# Patient Record
Sex: Female | Born: 1950 | Race: White | Hispanic: No | Smoking: Never smoker
Health system: Southern US, Community
[De-identification: ages and names within clinical notes are randomized; demographics above are authoritative.]

## PROBLEM LIST (undated history)

## (undated) HISTORY — PX: VARICOSE VEIN SURGERY: SHX832

## (undated) HISTORY — PX: TONSILLECTOMY: SUR1361

## (undated) HISTORY — PX: MYOMECTOMY: SHX85

## (undated) HISTORY — PX: OOPHORECTOMY: SHX86

## (undated) HISTORY — PX: COSMETIC SURGERY: SHX468

## (undated) HISTORY — PX: TUBAL LIGATION: SHX77

---

## 1998-01-11 ENCOUNTER — Other Ambulatory Visit: Admission: RE | Admit: 1998-01-11 | Discharge: 1998-01-11 | Payer: Self-pay | Admitting: Gynecology

## 1998-05-31 ENCOUNTER — Other Ambulatory Visit: Admission: RE | Admit: 1998-05-31 | Discharge: 1998-05-31 | Payer: Self-pay | Admitting: Gynecology

## 1998-09-27 ENCOUNTER — Other Ambulatory Visit: Admission: RE | Admit: 1998-09-27 | Discharge: 1998-09-27 | Payer: Self-pay | Admitting: Gynecology

## 1999-04-06 ENCOUNTER — Other Ambulatory Visit: Admission: RE | Admit: 1999-04-06 | Discharge: 1999-04-06 | Payer: Self-pay | Admitting: Gynecology

## 1999-06-19 ENCOUNTER — Other Ambulatory Visit: Admission: RE | Admit: 1999-06-19 | Discharge: 1999-06-19 | Payer: Self-pay | Admitting: General Surgery

## 2000-02-06 ENCOUNTER — Other Ambulatory Visit: Admission: RE | Admit: 2000-02-06 | Discharge: 2000-02-06 | Payer: Self-pay | Admitting: Gynecology

## 2001-09-08 ENCOUNTER — Other Ambulatory Visit: Admission: RE | Admit: 2001-09-08 | Discharge: 2001-09-08 | Payer: Self-pay | Admitting: Gynecology

## 2001-09-22 ENCOUNTER — Other Ambulatory Visit: Admission: RE | Admit: 2001-09-22 | Discharge: 2001-09-22 | Payer: Self-pay | Admitting: Gynecology

## 2001-11-12 ENCOUNTER — Encounter: Admission: RE | Admit: 2001-11-12 | Discharge: 2001-11-12 | Payer: Self-pay | Admitting: Gynecology

## 2001-11-12 ENCOUNTER — Encounter: Payer: Self-pay | Admitting: Gynecology

## 2003-02-09 ENCOUNTER — Other Ambulatory Visit: Admission: RE | Admit: 2003-02-09 | Discharge: 2003-02-09 | Payer: Self-pay | Admitting: Gynecology

## 2004-02-10 ENCOUNTER — Other Ambulatory Visit: Admission: RE | Admit: 2004-02-10 | Discharge: 2004-02-10 | Payer: Self-pay | Admitting: Gynecology

## 2010-04-03 ENCOUNTER — Ambulatory Visit: Payer: Self-pay | Admitting: Family Medicine

## 2010-04-03 DIAGNOSIS — B351 Tinea unguium: Secondary | ICD-10-CM | POA: Insufficient documentation

## 2010-05-30 ENCOUNTER — Encounter: Payer: Self-pay | Admitting: Family Medicine

## 2010-06-27 NOTE — Assessment & Plan Note (Signed)
Summary: BRAND NEW PT/TO EST/BURN ON HAND/CJR   Vital Signs:  Patient profile:   60 year old female Menstrual status:  hysterectomy Height:      64 inches Weight:      130 pounds BMI:     22.40 Temp:     98.3 degrees F oral BP sitting:   116 / 70  (left arm) Cuff size:   regular  Vitals Entered By: Kern Reap CMA Duncan Dull) (April 03, 2010 8:34 AM) CC: left hand tingling, concerns with thumb nails Comments while using cell phone, tingling and burning from left hand to elbow     Menstrual Status hysterectomy   CC:  left hand tingling and concerns with thumb nails.  History of Present Illness: milia is a 60 year old single female, G2, P2, whose has a 49 year old daughter.........Marland Kitchen  Another daughter, who died at age 27 of a ruptured cns  aneurysm.......Marland Kitchenwho comes in today as a new patient for general checkup.  She said last Tuesday.  She was using her cell phone and noticed some burning sensation in her hand.  She took her cell phone to the phone company and they said it was normal.  She also has some discoloration of her thumb nails.  Review of systems negative except she is due for colonoscopy and a tetanus booster............she declines a colonoscopy also  Pap smear annually by Dr. Laural Roes.  She had one ovary removed.  Bilateral breast implants varicose vein surgery, right leg, and a tonsillectomy.  She's been on Estratest h.s. x 10 years.  Information given on hormone replacement therapy.    She declined a flu shot   Preventive Screening-Counseling & Management  Alcohol-Tobacco     Smoking Status: never  Hep-HIV-STD-Contraception     Dental Visit-last 6 months yes      Drug Use:  no.    Allergies (verified): No Known Drug Allergies  Past History:  Past medical, surgical, family and social histories (including risk factors) reviewed, and no changes noted (except as noted below).  Past Surgical History: Oophorectomy cosmetic surgery vein surgery left  leg Tonsillectomy  Family History: Reviewed history and no changes required. Father: deceased Mother: healthy Siblings: 3 brothers - healthy, 1 sister - healthy  1 daughter - healthy, 1 daughter - deceased  Social History: Reviewed history and no changes required. Occupation:medical records Divorced Never Smoked Alcohol use-yes Drug use-no Smoking Status:  never Drug Use:  no Dental Care w/in 6 mos.:  yes  Review of Systems      See HPI  Physical Exam  General:  Well-developed,well-nourished,in no acute distress; alert,appropriate and cooperative throughout examination Head:  Normocephalic and atraumatic without obvious abnormalities. No apparent alopecia or balding. Eyes:  No corneal or conjunctival inflammation noted. EOMI. Perrla. Funduscopic exam benign, without hemorrhages, exudates or papilledema. Vision grossly normal. Ears:  External ear exam shows no significant lesions or deformities.  Otoscopic examination reveals clear canals, tympanic membranes are intact bilaterally without bulging, retraction, inflammation or discharge. Hearing is grossly normal bilaterally. Nose:  External nasal examination shows no deformity or inflammation. Nasal mucosa are pink and moist without lesions or exudates. Mouth:  Oral mucosa and oropharynx without lesions or exudates.  Teeth in good repair. Neck:  No deformities, masses, or tenderness noted. Chest Wall:  No deformities, masses, or tenderness noted. Breasts:  no palpable masses.  Bilateral implants Lungs:  Normal respiratory effort, chest expands symmetrically. Lungs are clear to auscultation, no crackles or wheezes. Heart:  Normal rate and regular  rhythm. S1 and S2 normal without gallop, murmur, click, rub or other extra sounds. Abdomen:  Bowel sounds positive,abdomen soft and non-tender without masses, organomegaly or hernias noted. Msk:  No deformity or scoliosis noted of thoracic or lumbar spine.   Pulses:  R and L  carotid,radial,femoral,dorsalis pedis and posterior tibial pulses are full and equal bilaterally Extremities:  No clubbing, cyanosis, edema, or deformity noted with normal full range of motion of all joints.   Neurologic:  No cranial nerve deficits noted. Station and gait are normal. Plantar reflexes are down-going bilaterally. DTRs are symmetrical throughout. Sensory, motor and coordinative functions appear intact. Skin:  Intact without suspicious lesions or rashes Cervical Nodes:  No lymphadenopathy noted Axillary Nodes:  No palpable lymphadenopathy Inguinal Nodes:  No significant adenopathy Psych:  Cognition and judgment appear intact. Alert and cooperative with normal attention span and concentration. No apparent delusions, illusions, hallucinations   Impression & Recommendations:  Problem # 1:  Preventive Health Care (ICD-V70.0) Assessment New  Problem # 2:  DERMATOPHYTOSIS OF NAIL (ICD-110.1) Assessment: New  Complete Medication List: 1)  Estrostep Fe 1-20/1-30/1-35 Mg-mcg Tabs (Norethindron-ethinyl estrad-fe)  Other Orders: Gastroenterology Referral (GI) Tdap => 85yrs IM (29528) Admin 1st Vaccine (41324)  Patient Instructions: 1)  soak in volume nails weekly until the fungus is gone. 2)  Please schedule a follow-up appointment in 1 year. 3)  It is important that you exercise regularly at least 20 minutes 5 times a week. If you develop chest pain, have severe difficulty breathing, or feel very tired , stop exercising immediately and seek medical attention. 4)  Schedule your mammogram. 5)  Schedule a colonoscopy/sigmoidoscopy to help detect colon cancer. 6)  Take calcium +Vitamin D daily. 7)  Take an Aspirin every day.   Orders Added: 1)  New Patient 40-64 years [99386] 2)  Gastroenterology Referral [GI] 3)  Tdap => 32yrs IM [90715] 4)  Admin 1st Vaccine [90471]   Immunizations Administered:  Tetanus Vaccine:    Vaccine Type: Tdap    Site: right deltoid    Mfr:  GlaxoSmithKline    Dose: 0.5 ml    Route: IM    Given by: Kern Reap CMA (AAMA)    Exp. Date: 03/16/2012    Lot #: [id number]    VIS given: 04/14/08 version given April 03, 2010.    Physician counseled: yes   Immunizations Administered:  Tetanus Vaccine:    Vaccine Type: Tdap    Site: right deltoid    Mfr: GlaxoSmithKline    Dose: 0.5 ml    Route: IM    Given by: Kern Reap CMA (AAMA)    Exp. Date: 03/16/2012    Lot #: [id number]    VIS given: 04/14/08 version given April 03, 2010.    Physician counseled: yes

## 2010-06-29 NOTE — Letter (Signed)
Summary: Referral - not able to see patient  [facility]  [address] [place], [place] [address]   Phone: [phone]  Fax: [phone]    May 30, 2010  Kelle Darting, MD [address] [place], [place] [address]   Re:   Yesenia Watson DOB:  16-Sep-1950 MRN:   [id number]    Dear Dr. Tawanna Cooler:  Thank you for your kind referral of the above patient.  We have attempted to schedule the recommended procedure Screening Colonoscopy but have not been able to schedule because:  ___ The patient was not available by phone and/or has not returned our calls.  X   The patient declined to schedule the procedure at this time.  We appreciate the referral and hope that we will have the opportunity to treat this patient in the future.    Sincerely,    [facility] [phone]

## 2011-06-27 ENCOUNTER — Ambulatory Visit: Payer: Self-pay | Admitting: Family Medicine

## 2011-06-28 ENCOUNTER — Ambulatory Visit: Payer: Self-pay | Admitting: Family Medicine

## 2012-08-07 ENCOUNTER — Ambulatory Visit: Payer: BC Managed Care – PPO

## 2012-08-07 ENCOUNTER — Ambulatory Visit (INDEPENDENT_AMBULATORY_CARE_PROVIDER_SITE_OTHER): Payer: BC Managed Care – PPO | Admitting: Family Medicine

## 2012-08-07 VITALS — BP 118/60 | HR 60 | Temp 98.1°F | Resp 16 | Ht 64.0 in | Wt 131.4 lb

## 2012-08-07 DIAGNOSIS — K5900 Constipation, unspecified: Secondary | ICD-10-CM

## 2012-08-07 DIAGNOSIS — G8929 Other chronic pain: Secondary | ICD-10-CM

## 2012-08-07 DIAGNOSIS — R109 Unspecified abdominal pain: Secondary | ICD-10-CM

## 2012-08-07 DIAGNOSIS — R1011 Right upper quadrant pain: Secondary | ICD-10-CM

## 2012-08-07 DIAGNOSIS — K5909 Other constipation: Secondary | ICD-10-CM

## 2012-08-07 LAB — POCT URINALYSIS DIPSTICK
Bilirubin, UA: NEGATIVE
Glucose, UA: NEGATIVE
Ketones, UA: NEGATIVE
Leukocytes, UA: NEGATIVE
Nitrite, UA: NEGATIVE
Protein, UA: NEGATIVE
Spec Grav, UA: <=1.005
Urobilinogen, UA: 0.2
pH, UA: 6

## 2012-08-07 LAB — POCT UA - MICROSCOPIC ONLY
Bacteria, U Microscopic: NEGATIVE
Casts, Ur, LPF, POC: NEGATIVE
Crystals, Ur, HPF, POC: NEGATIVE
Epithelial cells, urine per micros: NEGATIVE
Mucus, UA: NEGATIVE
WBC, Ur, HPF, POC: NEGATIVE
Yeast, UA: NEGATIVE

## 2012-08-07 LAB — POCT CBC
Granulocyte percent: 46.5 %G (ref 37–80)
HCT, POC: 35.8 % — AB (ref 37.7–47.9)
Hemoglobin: 11.6 g/dL — AB (ref 12.2–16.2)
Lymph, poc: 2.3 (ref 0.6–3.4)
MCH, POC: 30 pg (ref 27–31.2)
MCHC: 32.4 g/dL (ref 31.8–35.4)
MCV: 92.6 fL (ref 80–97)
MID (cbc): 0.3 (ref 0–0.9)
MPV: 9.5 fL (ref 0–99.8)
POC Granulocyte: 2.3 (ref 2–6.9)
POC LYMPH PERCENT: 47.1 %L (ref 10–50)
POC MID %: 6.4 %M (ref 0–12)
Platelet Count, POC: 216 10*3/uL (ref 142–424)
RBC: 3.87 M/uL — AB (ref 4.04–5.48)
RDW, POC: 13.1 %
WBC: 4.9 10*3/uL (ref 4.6–10.2)

## 2012-08-07 NOTE — Progress Notes (Signed)
Subjective:       Results for orders placed in visit on 08/07/12  POCT CBC      Result Value Range   WBC 4.9  4.6 - 10.2 K/uL   Lymph, poc 2.3  0.6 - 3.4   POC LYMPH PERCENT 47.1  10 - 50 %L   MID (cbc) 0.3  0 - 0.9   POC MID % 6.4  0 - 12 %M   POC Granulocyte 2.3  2 - 6.9   Granulocyte percent 46.5  37 - 80 %G   RBC 3.87 (*) 4.04 - 5.48 M/uL   Hemoglobin 11.6 (*) 12.2 - 16.2 g/dL   HCT, POC 96.0 (*) 45.4 - 47.9 %   MCV 92.6  80 - 97 fL   MCH, POC 30.0  27 - 31.2 pg   MCHC 32.4  31.8 - 35.4 g/dL   RDW, POC 09.8     Platelet Count, POC 216  142 - 424 K/uL   MPV 9.5  0 - 99.8 fL  POCT UA - MICROSCOPIC ONLY      Result Value Range   WBC, Ur, HPF, POC neg     RBC, urine, microscopic 0-2     Bacteria, U Microscopic neg     Mucus, UA neg     Epithelial cells, urine per micros neg     Crystals, Ur, HPF, POC neg     Casts, Ur, LPF, POC neg     Yeast, UA neg    POCT URINALYSIS DIPSTICK      Result Value Range   Color, UA yellow     Clarity, UA clear     Glucose, UA neg     Bilirubin, UA neg     Ketones, UA neg     Spec Grav, UA <=1.005     Blood, UA small     pH, UA 6.0     Protein, UA neg     Urobilinogen, UA 0.2     Nitrite, UA neg     Leukocytes, UA Negative     [facility] reading (PRIMARY) by  Dr. Alwyn Ren   .

## 2012-08-07 NOTE — Patient Instructions (Addendum)
Fluids  Fiber  Miralax one dose daily (2 if needed) until stools are loose, then taper back.  In the future use as needed.  When bowels are doing well begin metamucil or another good fiber additive  Recommend getting a screening colonoscopy sometime.  Call Dr. Elnoria Howard and [address] or [facility] for an appointment.

## 2012-08-08 LAB — COMPREHENSIVE METABOLIC PANEL
ALT: 22 U/L (ref 0–35)
AST: 20 U/L (ref 0–37)
Albumin: 4.4 g/dL (ref 3.5–5.2)
Alkaline Phosphatase: 38 U/L — ABNORMAL LOW (ref 39–117)
BUN: 12 mg/dL (ref 6–23)
CO2: 26 mEq/L (ref 19–32)
Calcium: 9.6 mg/dL (ref 8.4–10.5)
Chloride: 102 mEq/L (ref 96–112)
Creat: 0.83 mg/dL (ref 0.50–1.10)
Glucose, Bld: 103 mg/dL — ABNORMAL HIGH (ref 70–99)
Potassium: 4.2 mEq/L (ref 3.5–5.3)
Sodium: 135 mEq/L (ref 135–145)
Total Bilirubin: 0.5 mg/dL (ref 0.3–1.2)
Total Protein: 6.8 g/dL (ref 6.0–8.3)

## 2012-08-08 LAB — TSH: TSH: 3.68 u[IU]/mL (ref 0.350–4.500)

## 2012-08-09 ENCOUNTER — Encounter: Payer: Self-pay | Admitting: *Deleted

## 2012-08-11 ENCOUNTER — Telehealth: Payer: Self-pay | Admitting: Radiology

## 2012-08-11 DIAGNOSIS — R1011 Right upper quadrant pain: Secondary | ICD-10-CM

## 2012-08-11 NOTE — Telephone Encounter (Signed)
Reordered complete Abd [place] at [facility] this should not have gone to the breast center.

## 2012-08-11 NOTE — Telephone Encounter (Signed)
Message copied by Caffie Damme on Mon Aug 11, 2012  1:02 PM ------      Message from: Kenard Gower      Created: Fri Aug 08, 2012  9:22 AM      Regarding: EPIC ORDER       We received and order for a US Abdomen Limited.  We do not do this type of test. If the patient is having breast pain we would do a diagnostic mammogram. Please change order and resend.            Thanks      Scheduling  ------

## 2012-08-12 ENCOUNTER — Other Ambulatory Visit: Payer: Self-pay

## 2012-08-14 ENCOUNTER — Other Ambulatory Visit: Payer: Self-pay

## 2012-08-18 ENCOUNTER — Ambulatory Visit
Admission: RE | Admit: 2012-08-18 | Discharge: 2012-08-18 | Disposition: A | Discharge status: Home / Self Care | Payer: BC Managed Care – PPO | Source: Ambulatory Visit | Attending: Family Medicine | Admitting: Family Medicine

## 2012-08-18 DIAGNOSIS — G8929 Other chronic pain: Secondary | ICD-10-CM

## 2012-08-18 DIAGNOSIS — R1011 Right upper quadrant pain: Secondary | ICD-10-CM

## 2012-08-21 ENCOUNTER — Telehealth: Payer: Self-pay | Admitting: Family Medicine

## 2012-08-21 DIAGNOSIS — R935 Abnormal findings on diagnostic imaging of other abdominal regions, including retroperitoneum: Secondary | ICD-10-CM

## 2012-08-21 DIAGNOSIS — R1011 Right upper quadrant pain: Secondary | ICD-10-CM

## 2012-08-21 NOTE — Telephone Encounter (Signed)
Already spoke to patient.

## 2012-08-21 NOTE — Telephone Encounter (Signed)
I spoke with patient on the phone and explained we need an MR to further assess gallbladder.  I note that my previous office note ended up incomplete by error. I asked the history again.  Her main complaint was the chronic constipation with the nagging intermittent RUQ aching pain.  She is a little better on the constipation, but still needs a colonoscopy sometime.  Explained we will schedule the MRI of abdomen without and with contrast per radiologist and contact her.

## 2012-08-22 ENCOUNTER — Telehealth: Payer: Self-pay

## 2012-08-22 NOTE — Telephone Encounter (Signed)
She is to have MRI abdomen with and without contrast. There is an area of her gall bladder radiology is concerned about. I have spoken to her and helped her with her questions. To you FYI

## 2012-08-22 NOTE — Telephone Encounter (Signed)
Noted Amy's note.

## 2012-08-22 NOTE — Telephone Encounter (Signed)
Dr. Alwyn Ren    Patient was in public area during the previous conversation and could not openly ask questions.  Please call  [phone]

## 2012-08-25 ENCOUNTER — Other Ambulatory Visit: Payer: Self-pay | Admitting: Radiology

## 2012-08-25 DIAGNOSIS — R935 Abnormal findings on diagnostic imaging of other abdominal regions, including retroperitoneum: Secondary | ICD-10-CM

## 2012-08-25 NOTE — Addendum Note (Signed)
Addended byCaffie Damme on: 08/25/2012 11:36 AM   Modules accepted: Orders

## 2012-08-26 ENCOUNTER — Ambulatory Visit
Admission: RE | Admit: 2012-08-26 | Discharge: 2012-08-26 | Disposition: A | Discharge status: Home / Self Care | Payer: BC Managed Care – PPO | Source: Ambulatory Visit | Attending: Family Medicine | Admitting: Family Medicine

## 2012-08-26 ENCOUNTER — Inpatient Hospital Stay: Admission: RE | Admit: 2012-08-26 | Payer: BC Managed Care – PPO | Source: Ambulatory Visit

## 2012-08-26 DIAGNOSIS — R935 Abnormal findings on diagnostic imaging of other abdominal regions, including retroperitoneum: Secondary | ICD-10-CM

## 2012-08-26 MED ORDER — GADOBENATE DIMEGLUMINE 529 MG/ML IV SOLN
12.0000 mL | Freq: Once | INTRAVENOUS | Status: AC | PRN
  Administered 2012-08-26: 12 mL via INTRAVENOUS

## 2013-01-22 ENCOUNTER — Telehealth: Payer: Self-pay

## 2013-01-22 DIAGNOSIS — R935 Abnormal findings on diagnostic imaging of other abdominal regions, including retroperitoneum: Secondary | ICD-10-CM

## 2013-01-22 NOTE — Telephone Encounter (Signed)
Pt is needing to get a new order for an ultrasound of abdomen she states that it is a follow up ultrasound from march of 2014   Best number [phone]

## 2013-01-22 NOTE — Telephone Encounter (Signed)
Order put in. Needs MRI not US/ done

## 2013-04-06 ENCOUNTER — Other Ambulatory Visit: Payer: Self-pay | Admitting: Gastroenterology

## 2013-04-06 DIAGNOSIS — R109 Unspecified abdominal pain: Secondary | ICD-10-CM

## 2013-04-16 ENCOUNTER — Ambulatory Visit
Admission: RE | Admit: 2013-04-16 | Discharge: 2013-04-16 | Disposition: A | Discharge status: Home / Self Care | Payer: BC Managed Care – PPO | Source: Ambulatory Visit | Attending: Gastroenterology | Admitting: Gastroenterology

## 2013-04-16 DIAGNOSIS — R109 Unspecified abdominal pain: Secondary | ICD-10-CM

## 2013-04-16 MED ORDER — GADOBENATE DIMEGLUMINE 529 MG/ML IV SOLN
12.0000 mL | Freq: Once | INTRAVENOUS | Status: AC | PRN
  Administered 2013-04-16: 12 mL via INTRAVENOUS

## 2013-04-27 ENCOUNTER — Encounter (INDEPENDENT_AMBULATORY_CARE_PROVIDER_SITE_OTHER): Payer: Self-pay | Admitting: Surgery

## 2013-04-29 ENCOUNTER — Encounter (INDEPENDENT_AMBULATORY_CARE_PROVIDER_SITE_OTHER): Payer: Self-pay

## 2013-04-29 ENCOUNTER — Ambulatory Visit (INDEPENDENT_AMBULATORY_CARE_PROVIDER_SITE_OTHER): Payer: BC Managed Care – PPO | Admitting: Surgery

## 2013-04-29 ENCOUNTER — Encounter (INDEPENDENT_AMBULATORY_CARE_PROVIDER_SITE_OTHER): Payer: Self-pay | Admitting: Surgery

## 2013-04-29 VITALS — BP 120/76 | HR 72 | Temp 98.7°F | Resp 14 | Ht 64.0 in | Wt 134.8 lb

## 2013-04-29 DIAGNOSIS — D134 Benign neoplasm of liver: Secondary | ICD-10-CM

## 2013-04-29 DIAGNOSIS — D135 Benign neoplasm of extrahepatic bile ducts: Secondary | ICD-10-CM

## 2013-04-29 NOTE — Progress Notes (Signed)
Patient ID: Yesenia Watson, female   DOB: 14-Aug-1950, 62 y.o.   MRN: [id number]  Chief Complaint  Patient presents with  . New Evaluation    eval GB adenomyomatosis    HPI Yesenia Watson is a 62 y.o. female.   HPI This is a pleasant female referred to me by Dr. Jeani Hawking for evaluation of right upper quadrant abdominal pain and gallbladder adenomatosis. She actually has had  Right upper quadrant abdominal pain for some time. It has actually improved since she has been taking Metamucil and her constipation is resolving. In March she had an ultrasound demonstrating large adenoma in the gallbladder. This was confirmed on MRI. She has had a followup MRI and a large adenoma is unchanged measuring 14 mm x 16 mm in size. She does still have intermittent pain in the right upper quadrant hurting through to the back. She denies nausea. The pain is only mild to moderate. She cannot relate anything she eats. History reviewed. No pertinent past medical history.  Past Surgical History  Procedure Laterality Date  . Cosmetic surgery    . Myomectomy    . Varicose vein surgery    . Oophorectomy    . Tubal ligation      Family History  Problem Relation Age of Onset  . Cancer Father     bladder  . Emphysema Father   . Aneurysm Daughter   . Cancer Maternal Grandmother     pancreaic  . Cancer Maternal Grandfather     larynx  . Cancer Paternal Grandmother     bladder  . Cancer Paternal Grandfather   . Cancer Maternal Aunt     colon    Social History History  Substance Use Topics  . Smoking status: Never Smoker   . Smokeless tobacco: Never Used  . Alcohol Use: Yes     Comment: wine    No Known Allergies  Current Outpatient Prescriptions  Medication Sig Dispense Refill  . estrogen-methylTESTOSTERone (ESTRATEST) 1.25-2.5 MG per tablet Take 1 tablet by mouth daily.      . progesterone (PROMETRIUM) 200 MG capsule Take 200 mg by mouth daily.       No current facility-administered  medications for this visit.    Review of Systems Review of Systems  Constitutional: Negative for fever, chills and unexpected weight change.  HENT: Negative for congestion, hearing loss, sore throat, trouble swallowing and voice change.   Eyes: Negative for visual disturbance.  Respiratory: Negative for cough and wheezing.   Cardiovascular: Negative for chest pain, palpitations and leg swelling.  Gastrointestinal: Positive for abdominal pain. Negative for nausea, vomiting, diarrhea, constipation, blood in stool, abdominal distention and anal bleeding.  Genitourinary: Negative for hematuria, vaginal bleeding and difficulty urinating.  Musculoskeletal: Negative for arthralgias.  Skin: Negative for rash and wound.  Neurological: Negative for seizures, syncope and headaches.  Hematological: Negative for adenopathy. Does not bruise/bleed easily.  Psychiatric/Behavioral: Negative for confusion.    Blood pressure 120/76, pulse 72, temperature 98.7 F (37.1 C), temperature source Temporal, resp. rate 14, height 5\' 4"  (1.626 m), weight 134 lb 12.8 oz (61.145 kg).  Physical Exam Physical Exam  Constitutional: She is oriented to person, place, and time. She appears well-developed and well-nourished. No distress.  HENT:  Head: Normocephalic and atraumatic.  Right Ear: External ear normal.  Left Ear: External ear normal.  Nose: Nose normal.  Mouth/Throat: Oropharynx is clear and moist.  Eyes: Conjunctivae are normal. Pupils are equal, round, and reactive to light.  Right eye exhibits no discharge. Left eye exhibits no discharge. No scleral icterus.  Neck: Normal range of motion. Neck supple. No tracheal deviation present.  Cardiovascular: Normal rate, regular rhythm, normal heart sounds and intact distal pulses.   No murmur heard. Pulmonary/Chest: Effort normal and breath sounds normal. No respiratory distress. She has no wheezes.  Abdominal: Soft. Bowel sounds are normal. She exhibits no  distension. There is no tenderness. There is no rebound.  Musculoskeletal: Normal range of motion. She exhibits no edema and no tenderness.  Lymphadenopathy:    She has no cervical adenopathy.  Neurological: She is alert and oriented to person, place, and time.  Skin: Skin is warm and dry. No rash noted. She is not diaphoretic. No erythema.  Psychiatric: Her behavior is normal. Judgment normal.    Data Reviewed I have reviewed her previous ultrasounds and MRI. These demonstrate the adenomatous changes in the gallbladder. 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 Nonetheless, given the large size of the adenoma in the gallbladder on MRI, I would recommend cholecystectomy to rule out malignancy. I discussed laparoscopic cholecystectomy with her in detail. I discussed the risk of surgery which includes is not limited to bleeding, infection, injury to surrounding structures, the need to convert to an open procedure, postoperative recovery, et Karie Soda. She is scheduled to have a colonoscopy on December 16. I will schedule her for laparoscopic cholecystectomy following this date. She is in agreement and wishes to proceed.        [name] A 04/29/2013, 4:05 PM

## 2013-05-06 ENCOUNTER — Encounter (HOSPITAL_COMMUNITY): Payer: Self-pay

## 2013-05-07 ENCOUNTER — Encounter (HOSPITAL_COMMUNITY): Payer: Self-pay

## 2013-05-07 ENCOUNTER — Encounter (HOSPITAL_COMMUNITY)
Admission: RE | Admit: 2013-05-07 | Discharge: 2013-05-07 | Disposition: A | Discharge status: Home / Self Care | Payer: BC Managed Care – PPO | Source: Ambulatory Visit | Attending: Surgery | Admitting: Surgery

## 2013-05-07 DIAGNOSIS — Z01818 Encounter for other preprocedural examination: Secondary | ICD-10-CM | POA: Insufficient documentation

## 2013-05-07 DIAGNOSIS — Z01812 Encounter for preprocedural laboratory examination: Secondary | ICD-10-CM | POA: Insufficient documentation

## 2013-05-07 LAB — BASIC METABOLIC PANEL
BUN: 15 mg/dL (ref 6–23)
CO2: 29 mEq/L (ref 19–32)
Calcium: 9.5 mg/dL (ref 8.4–10.5)
Chloride: 104 mEq/L (ref 96–112)
Creatinine, Ser: 0.87 mg/dL (ref 0.50–1.10)
GFR calc Af Amer: 80 mL/min — ABNORMAL LOW (ref >90)
GFR calc non Af Amer: 69 mL/min — ABNORMAL LOW (ref >90)
Glucose, Bld: 88 mg/dL (ref 70–99)
Potassium: 4 mEq/L (ref 3.5–5.1)
Sodium: 141 mEq/L (ref 135–145)

## 2013-05-07 LAB — CBC
HCT: 35.8 % — ABNORMAL LOW (ref 36.0–46.0)
Hemoglobin: 12.1 g/dL (ref 12.0–15.0)
MCH: 30.4 pg (ref 26.0–34.0)
MCHC: 33.8 g/dL (ref 30.0–36.0)
MCV: 89.9 fL (ref 78.0–100.0)
Platelets: 193 10*3/uL (ref 150–400)
RBC: 3.98 MIL/uL (ref 3.87–5.11)
RDW: 12 % (ref 11.5–15.5)
WBC: 5.5 10*3/uL (ref 4.0–10.5)

## 2013-05-07 NOTE — Progress Notes (Signed)
Denies having a cardiologist or PCP. Denies having a recent EKG or CXR. Denies having a stress test, echo, or card cath.

## 2013-05-07 NOTE — Pre-Procedure Instructions (Signed)
[name]  05/07/2013   Your procedure is scheduled on:  Dec 19 @1215   Report to [facility]  2 * 3 at 0915 AM.  Call this number if you have problems the morning of surgery: [phone]   Remember:   Do not eat food or drink liquids after midnight.   Take these medicines the morning of surgery with A SIP OF WATER: None  Stop taking BC's, Goody's, Aspirin, Ibuprofen, Herbal medications, Fish Oil, Aleve   Do not wear jewelry, make-up or nail polish.  Do not wear lotions, powders, or perfumes. You may wear deodorant.  Do not shave 48 hours prior to surgery. Men may shave face and neck.  Do not bring valuables to the hospital.  [facility] is not responsible                  for any belongings or valuables.               Contacts, dentures or bridgework may not be worn into surgery.  Leave suitcase in the car. After surgery it may be brought to your room.  For patients admitted to the hospital, discharge time is determined by your                treatment team.               Patients discharged the day of surgery will not be allowed to drive  home.    Special Instructions: Shower using CHG 2 nights before surgery and the night before surgery.  If you shower the day of surgery use CHG.  Use special wash - you have one bottle of CHG for all showers.  You should use approximately 1/3 of the bottle for each shower.   Please read over the following fact sheets that you were given: Pain Booklet, Coughing and Deep Breathing and Surgical Site Infection Prevention

## 2013-05-14 MED ORDER — CEFAZOLIN SODIUM-DEXTROSE 2-3 GM-% IV SOLR
2.0000 g | INTRAVENOUS | Status: AC
  Administered 2013-05-15: 2 g via INTRAVENOUS
  Filled 2013-05-14: qty 50

## 2013-05-14 NOTE — H&P (Signed)
Chief Complaint   Patient presents with   .  New Evaluation     eval GB adenomyomatosis   HPI  Yesenia Watson is a 62 y.o. female.  HPI  This is a pleasant female referred to me by Dr. Jeani Hawking for evaluation of right upper quadrant abdominal pain and gallbladder adenomatosis. She actually has had Right upper quadrant abdominal pain for some time. It has actually improved since she has been taking Metamucil and her constipation is resolving. In March she had an ultrasound demonstrating large adenoma in the gallbladder. This was confirmed on MRI. She has had a followup MRI and a large adenoma is unchanged measuring 14 mm x 16 mm in size. She does still have intermittent pain in the right upper quadrant hurting through to the back. She denies nausea. The pain is only mild to moderate. She cannot relate anything she eats.  History reviewed. No pertinent past medical history.  Past Surgical History   Procedure  Laterality  Date   .  Cosmetic surgery     .  Myomectomy     .  Varicose vein surgery     .  Oophorectomy     .  Tubal ligation      Family History   Problem  Relation  Age of Onset   .  Cancer  Father      bladder   .  Emphysema  Father    .  Aneurysm  Daughter    .  Cancer  Maternal Grandmother      pancreaic   .  Cancer  Maternal Grandfather      larynx   .  Cancer  Paternal Grandmother      bladder   .  Cancer  Paternal Grandfather    .  Cancer  Maternal Aunt      colon   Social History  History   Substance Use Topics   .  Smoking status:  Never Smoker   .  Smokeless tobacco:  Never Used   .  Alcohol Use:  Yes      Comment: wine   No Known Allergies  Current Outpatient Prescriptions   Medication  Sig  Dispense  Refill   .  estrogen-methylTESTOSTERone (ESTRATEST) 1.25-2.5 MG per tablet  Take 1 tablet by mouth daily.     .  progesterone (PROMETRIUM) 200 MG capsule  Take 200 mg by mouth daily.      No current facility-administered medications for this visit.    Review of Systems  Review of Systems  Constitutional: Negative for fever, chills and unexpected weight change.  HENT: Negative for congestion, hearing loss, sore throat, trouble swallowing and voice change.  Eyes: Negative for visual disturbance.  Respiratory: Negative for cough and wheezing.  Cardiovascular: Negative for chest pain, palpitations and leg swelling.  Gastrointestinal: Positive for abdominal pain. Negative for nausea, vomiting, diarrhea, constipation, blood in stool, abdominal distention and anal bleeding.  Genitourinary: Negative for hematuria, vaginal bleeding and difficulty urinating.  Musculoskeletal: Negative for arthralgias.  Skin: Negative for rash and wound.  Neurological: Negative for seizures, syncope and headaches.  Hematological: Negative for adenopathy. Does not bruise/bleed easily.  Psychiatric/Behavioral: Negative for confusion.  Blood pressure 120/76, pulse 72, temperature 98.7 F (37.1 C), temperature source Temporal, resp. rate 14, height 5\' 4"  (1.626 m), weight 134 lb 12.8 oz (61.145 kg).  Physical Exam  Physical Exam  Constitutional: She is oriented to person, place, and time. She appears well-developed and well-nourished.  No distress.  HENT:  Head: Normocephalic and atraumatic.  Right Ear: External ear normal.  Left Ear: External ear normal.  Nose: Nose normal.  Mouth/Throat: Oropharynx is clear and moist.  Eyes: Conjunctivae are normal. Pupils are equal, round, and reactive to light. Right eye exhibits no discharge. Left eye exhibits no discharge. No scleral icterus.  Neck: Normal range of motion. Neck supple. No tracheal deviation present.  Cardiovascular: Normal rate, regular rhythm, normal heart sounds and intact distal pulses.  No murmur heard.  Pulmonary/Chest: Effort normal and breath sounds normal. No respiratory distress. She has no wheezes.  Abdominal: Soft. Bowel sounds are normal. She exhibits no distension. There is no tenderness. There  is no rebound.  Musculoskeletal: Normal range of motion. She exhibits no edema and no tenderness.  Lymphadenopathy:  She has no cervical adenopathy.  Neurological: She is alert and oriented to person, place, and time.  Skin: Skin is warm and dry. No rash noted. She is not diaphoretic. No erythema.  Psychiatric: Her behavior is normal. Judgment normal.  Data Reviewed  I have reviewed her previous ultrasounds and MRI. These demonstrate the adenomatous changes in the gallbladder. 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 Nonetheless, given the large size of the adenoma in the gallbladder on MRI, I would recommend cholecystectomy to rule out malignancy. I discussed laparoscopic cholecystectomy with her in detail. I discussed the risk of surgery which includes is not limited to bleeding, infection, injury to surrounding structures, the need to convert to an open procedure, postoperative recovery, et Karie Soda. She is scheduled to have a colonoscopy on December 16. I will schedule her for laparoscopic cholecystectomy following this date. She is in agreement and wishes to proceed.

## 2013-05-15 ENCOUNTER — Ambulatory Visit (HOSPITAL_COMMUNITY)
Admission: RE | Admit: 2013-05-15 | Discharge: 2013-05-15 | Disposition: A | Discharge status: Home / Self Care | Payer: BC Managed Care – PPO | Source: Ambulatory Visit | Attending: Surgery | Admitting: Surgery

## 2013-05-15 ENCOUNTER — Encounter (HOSPITAL_COMMUNITY): Payer: Self-pay | Admitting: *Deleted

## 2013-05-15 ENCOUNTER — Ambulatory Visit (HOSPITAL_COMMUNITY): Payer: BC Managed Care – PPO | Admitting: Anesthesiology

## 2013-05-15 ENCOUNTER — Encounter (HOSPITAL_COMMUNITY): Payer: BC Managed Care – PPO | Admitting: Anesthesiology

## 2013-05-15 ENCOUNTER — Encounter (HOSPITAL_COMMUNITY)
Admission: RE | Disposition: A | Discharge status: Home / Self Care | Payer: Self-pay | Source: Ambulatory Visit | Attending: Surgery

## 2013-05-15 DIAGNOSIS — K811 Chronic cholecystitis: Secondary | ICD-10-CM

## 2013-05-15 DIAGNOSIS — D135 Benign neoplasm of extrahepatic bile ducts: Secondary | ICD-10-CM | POA: Insufficient documentation

## 2013-05-15 DIAGNOSIS — D134 Benign neoplasm of liver: Secondary | ICD-10-CM | POA: Insufficient documentation

## 2013-05-15 HISTORY — PX: CHOLECYSTECTOMY: SHX55

## 2013-05-15 SURGERY — LAPAROSCOPIC CHOLECYSTECTOMY
Anesthesia: General | Site: Abdomen

## 2013-05-15 MED ORDER — SODIUM CHLORIDE 0.9 % IR SOLN
Status: DC | PRN
  Administered 2013-05-15: 1000 mL

## 2013-05-15 MED ORDER — OXYCODONE HCL 5 MG/5ML PO SOLN
5.0000 mg | Freq: Once | ORAL | Status: DC | PRN
Start: 2013-05-15 — End: 2013-05-15

## 2013-05-15 MED ORDER — LIDOCAINE HCL (CARDIAC) 20 MG/ML IV SOLN
INTRAVENOUS | Status: DC | PRN
  Administered 2013-05-15: 80 mg via INTRAVENOUS

## 2013-05-15 MED ORDER — OXYCODONE HCL 5 MG PO TABS: 5.0000 mg | ORAL_TABLET | Freq: Once | ORAL | Status: DC | PRN

## 2013-05-15 MED ORDER — GLYCOPYRROLATE 0.2 MG/ML IJ SOLN
INTRAMUSCULAR | Status: DC | PRN
  Administered 2013-05-15: .8 mg via INTRAVENOUS

## 2013-05-15 MED ORDER — DEXAMETHASONE SODIUM PHOSPHATE 4 MG/ML IJ SOLN
INTRAMUSCULAR | Status: DC | PRN
  Administered 2013-05-15: 8 mg via INTRAVENOUS

## 2013-05-15 MED ORDER — BUPIVACAINE-EPINEPHRINE (PF) 0.25% -1:200000 IJ SOLN
INTRAMUSCULAR | Status: AC
  Filled 2013-05-15: qty 30

## 2013-05-15 MED ORDER — KETOROLAC TROMETHAMINE 15 MG/ML IJ SOLN
INTRAMUSCULAR | Status: DC | PRN
  Administered 2013-05-15: 30 mg via INTRAVENOUS

## 2013-05-15 MED ORDER — MIDAZOLAM HCL 5 MG/5ML IJ SOLN
INTRAMUSCULAR | Status: DC | PRN
  Administered 2013-05-15: 2 mg via INTRAVENOUS

## 2013-05-15 MED ORDER — ONDANSETRON HCL 4 MG/2ML IJ SOLN
INTRAMUSCULAR | Status: DC | PRN
  Administered 2013-05-15: 4 mg via INTRAVENOUS

## 2013-05-15 MED ORDER — NEOSTIGMINE METHYLSULFATE 1 MG/ML IJ SOLN
INTRAMUSCULAR | Status: DC | PRN
  Administered 2013-05-15: 5 mg via INTRAVENOUS

## 2013-05-15 MED ORDER — EPHEDRINE SULFATE 50 MG/ML IJ SOLN
INTRAMUSCULAR | Status: DC | PRN
  Administered 2013-05-15 (×2): 5 mg via INTRAVENOUS

## 2013-05-15 MED ORDER — HYDROCODONE-ACETAMINOPHEN 5-325 MG PO TABS: 1.0000 | ORAL_TABLET | ORAL | Status: DC | PRN

## 2013-05-15 MED ORDER — BUPIVACAINE-EPINEPHRINE 0.25% -1:200000 IJ SOLN
INTRAMUSCULAR | Status: DC | PRN
  Administered 2013-05-15: 30 mL

## 2013-05-15 MED ORDER — FENTANYL CITRATE 0.05 MG/ML IJ SOLN
INTRAMUSCULAR | Status: DC | PRN
  Administered 2013-05-15: 150 ug via INTRAVENOUS

## 2013-05-15 MED ORDER — ROCURONIUM BROMIDE 100 MG/10ML IV SOLN
INTRAVENOUS | Status: DC | PRN
  Administered 2013-05-15: 30 mg via INTRAVENOUS

## 2013-05-15 MED ORDER — HYDROMORPHONE HCL PF 1 MG/ML IJ SOLN
INTRAMUSCULAR | Status: AC
  Filled 2013-05-15: qty 1

## 2013-05-15 MED ORDER — HYDROCODONE-ACETAMINOPHEN 5-325 MG PO TABS
ORAL_TABLET | ORAL | Status: AC
  Administered 2013-05-15: 1
  Filled 2013-05-15: qty 1

## 2013-05-15 MED ORDER — ONDANSETRON HCL 4 MG/2ML IJ SOLN: 4.0000 mg | Freq: Once | INTRAMUSCULAR | Status: DC | PRN

## 2013-05-15 MED ORDER — LACTATED RINGERS IV SOLN
INTRAVENOUS | Status: DC | PRN
  Administered 2013-05-15 (×2): via INTRAVENOUS

## 2013-05-15 MED ORDER — ARTIFICIAL TEARS OP OINT
TOPICAL_OINTMENT | OPHTHALMIC | Status: DC | PRN
  Administered 2013-05-15: 1 via OPHTHALMIC

## 2013-05-15 MED ORDER — LACTATED RINGERS IV SOLN
INTRAVENOUS | Status: DC
  Administered 2013-05-15: 10:00:00 via INTRAVENOUS

## 2013-05-15 MED ORDER — HYDROMORPHONE HCL PF 1 MG/ML IJ SOLN
0.2500 mg | INTRAMUSCULAR | Status: DC | PRN
  Administered 2013-05-15 (×2): 0.5 mg via INTRAVENOUS

## 2013-05-15 MED ORDER — SODIUM CHLORIDE 0.9 % IR SOLN
Status: DC | PRN
  Administered 2013-05-15: 800 mL

## 2013-05-15 MED ORDER — PROPOFOL 10 MG/ML IV BOLUS
INTRAVENOUS | Status: DC | PRN
  Administered 2013-05-15: 170 mg via INTRAVENOUS

## 2013-05-15 MED ORDER — ESMOLOL HCL 10 MG/ML IV SOLN
INTRAVENOUS | Status: DC | PRN
  Administered 2013-05-15: 30 mg via INTRAVENOUS
  Administered 2013-05-15: 20 mg via INTRAVENOUS

## 2013-05-15 SURGICAL SUPPLY — 40 items
APL SKNCLS STERI-STRIP NONHPOA (GAUZE/BANDAGES/DRESSINGS) ×1
APPLIER CLIP 5 13 M/L LIGAMAX5 (MISCELLANEOUS) ×2
APR CLP MED LRG 5 ANG JAW (MISCELLANEOUS) ×1
BAG SPEC RTRVL LRG 6X4 10 (ENDOMECHANICALS) ×1
BANDAGE ADHESIVE 1X3 (GAUZE/BANDAGES/DRESSINGS) ×8 IMPLANT
BENZOIN TINCTURE PRP APPL 2/3 (GAUZE/BANDAGES/DRESSINGS) ×2 IMPLANT
BNDG ADH 5X3 H2O RPLNT NS (GAUZE/BANDAGES/DRESSINGS) ×1
BNDG COHESIVE 3X5 WHT NS (GAUZE/BANDAGES/DRESSINGS) ×2 IMPLANT
CANISTER SUCTION 2500CC (MISCELLANEOUS) ×2 IMPLANT
CHLORAPREP W/TINT 26ML (MISCELLANEOUS) ×2 IMPLANT
CLIP APPLIE 5 13 M/L LIGAMAX5 (MISCELLANEOUS) ×1 IMPLANT
COVER MAYO STAND STRL (DRAPES) IMPLANT
COVER SURGICAL LIGHT HANDLE (MISCELLANEOUS) ×2 IMPLANT
DECANTER SPIKE VIAL GLASS SM (MISCELLANEOUS) ×2 IMPLANT
DRAPE C-ARM 42X72 X-RAY (DRAPES) IMPLANT
ELECT REM PT RETURN 9FT ADLT (ELECTROSURGICAL) ×2
ELECTRODE REM PT RTRN 9FT ADLT (ELECTROSURGICAL) ×1 IMPLANT
GLOVE BIO SURGEON STRL SZ7 (GLOVE) ×2 IMPLANT
GLOVE BIOGEL PI IND STRL 7.0 (GLOVE) ×1 IMPLANT
GLOVE BIOGEL PI INDICATOR 7.0 (GLOVE) ×1
GLOVE SURG SIGNA 7.5 PF LTX (GLOVE) ×2 IMPLANT
GOWN STRL NON-REIN LRG LVL3 (GOWN DISPOSABLE) ×4 IMPLANT
GOWN STRL REIN XL XLG (GOWN DISPOSABLE) ×2 IMPLANT
KIT BASIN OR (CUSTOM PROCEDURE TRAY) ×2 IMPLANT
KIT ROOM TURNOVER OR (KITS) ×2 IMPLANT
NS IRRIG 1000ML POUR BTL (IV SOLUTION) ×2 IMPLANT
PAD ARMBOARD 7.5X6 YLW CONV (MISCELLANEOUS) ×2 IMPLANT
POUCH SPECIMEN RETRIEVAL 10MM (ENDOMECHANICALS) ×2 IMPLANT
SCISSORS LAP 5X35 DISP (ENDOMECHANICALS) ×2 IMPLANT
SET CHOLANGIOGRAPH 5 50 .035 (SET/KITS/TRAYS/PACK) IMPLANT
SET IRRIG TUBING LAPAROSCOPIC (IRRIGATION / IRRIGATOR) ×2 IMPLANT
SLEEVE ENDOPATH XCEL 5M (ENDOMECHANICALS) ×4 IMPLANT
SPECIMEN JAR SMALL (MISCELLANEOUS) ×2 IMPLANT
SUT MON AB 4-0 PC3 18 (SUTURE) ×2 IMPLANT
TOWEL OR 17X24 6PK STRL BLUE (TOWEL DISPOSABLE) ×2 IMPLANT
TOWEL OR 17X26 10 PK STRL BLUE (TOWEL DISPOSABLE) ×2 IMPLANT
TRAY LAPAROSCOPIC (CUSTOM PROCEDURE TRAY) ×2 IMPLANT
TROCAR XCEL BLUNT TIP 100MML (ENDOMECHANICALS) ×2 IMPLANT
TROCAR XCEL NON-BLD 5MMX100MML (ENDOMECHANICALS) ×2 IMPLANT
WATER STERILE IRR 1000ML POUR (IV SOLUTION) IMPLANT

## 2013-05-15 NOTE — Op Note (Signed)
Laparoscopic Cholecystectomy Procedure Note  Indications: This patient presents with symptomatic gallbladder disease and will undergo laparoscopic cholecystectomy.  Pre-operative Diagnosis: gallbladder adenomatosis  Post-operative Diagnosis: Same  Surgeon: Abigail Miyamoto A   Assistants: 0  Anesthesia: General endotracheal anesthesia  ASA Class: 2  Procedure Details  The patient was seen again in the Holding Room. The risks, benefits, complications, treatment options, and expected outcomes were discussed with the patient. The possibilities of reaction to medication, pulmonary aspiration, perforation of viscus, bleeding, recurrent infection, finding a normal gallbladder, the need for additional procedures, failure to diagnose a condition, the possible need to convert to an open procedure, and creating a complication requiring transfusion or operation were discussed with the patient. The likelihood of improving the patient's symptoms with return to their baseline status is good.  The patient and/or family concurred with the proposed plan, giving informed consent. The site of surgery properly noted. The patient was taken to Operating Room, identified as Yesenia Watson and the procedure verified as Laparoscopic Cholecystectomy with Intraoperative Cholangiogram. A Time Out was held and the above information confirmed.  Prior to the induction of general anesthesia, antibiotic prophylaxis was administered. General endotracheal anesthesia was then administered and tolerated well. After the induction, the abdomen was prepped with Chloraprep and draped in sterile fashion. The patient was positioned in the supine position.  Local anesthetic agent was injected into the skin near the umbilicus and an incision made. We dissected down to the abdominal fascia with blunt dissection.  The fascia was incised vertically and we entered the peritoneal cavity bluntly.  A pursestring suture of 0-Vicryl was placed  around the fascial opening.  The Hasson cannula was inserted and secured with the stay suture.  Pneumoperitoneum was then created with CO2 and tolerated well without any adverse changes in the patient's vital signs. An 11-mm port was placed in the subxiphoid position.  Two 5-mm ports were placed in the right upper quadrant. All skin incisions were infiltrated with a local anesthetic agent before making the incision and placing the trocars.   We positioned the patient in reverse Trendelenburg, tilted slightly to the patient's left.  The gallbladder was identified, the fundus grasped and retracted cephalad. Adhesions were lysed bluntly and with the electrocautery where indicated, taking care not to injure any adjacent organs or viscus. The infundibulum was grasped and retracted laterally, exposing the peritoneum overlying the triangle of Calot. This was then divided and exposed in a blunt fashion. The cystic duct was clearly identified and bluntly dissected circumferentially. A critical view of the cystic duct and cystic artery was obtained.  The cystic duct was then ligated with clips and divided. The cystic artery was, dissected free, ligated with clips and divided as well.   The gallbladder was dissected from the liver bed in retrograde fashion with the electrocautery. The gallbladder was removed and placed in an Endocatch sac. The liver bed was irrigated and inspected. Hemostasis was achieved with the electrocautery. Copious irrigation was utilized and was repeatedly aspirated until clear.  The gallbladder and Endocatch sac were then removed through the umbilical port site.  The pursestring suture was used to close the umbilical fascia.    We again inspected the right upper quadrant for hemostasis.  Pneumoperitoneum was released as we removed the trocars.  4-0 Monocryl was used to close the skin.   Benzoin, steri-strips, and clean dressings were applied. The patient was then extubated and brought to the  recovery room in stable condition. Instrument, sponge, and needle  counts were correct at closure and at the conclusion of the case.   Findings: Cholecystitis without Cholelithiasis  Estimated Blood Loss: Minimal         Drains: 0         Specimens: Gallbladder           Complications: None; patient tolerated the procedure well.         Disposition: PACU - hemodynamically stable.         Condition: stable

## 2013-05-15 NOTE — Preoperative (Signed)
Beta Blockers   Reason not to administer Beta Blockers:Not Applicable 

## 2013-05-15 NOTE — Anesthesia Postprocedure Evaluation (Signed)
  Anesthesia Post-op Note  Patient: Yesenia Watson  Procedure(s) Performed: Procedure(s): LAPAROSCOPIC CHOLECYSTECTOMY (N/A)  Patient Location: PACU  Anesthesia Type:General  Level of Consciousness: awake, alert  and oriented  Airway and Oxygen Therapy: Patient Spontanous Breathing and Patient connected to nasal cannula oxygen  Post-op Pain: mild  Post-op Assessment: Post-op Vital signs reviewed  Post-op Vital Signs: Reviewed  Complications: No apparent anesthesia complications

## 2013-05-15 NOTE — Anesthesia Preprocedure Evaluation (Signed)
Anesthesia Evaluation  Patient identified by MRN, date of birth, ID band Patient awake    Reviewed: Allergy & Precautions, H&P , NPO status , Patient's Chart, lab work & pertinent test results  Airway Mallampati: I TM Distance: >3 FB Neck ROM: Full    Dental  (+) Teeth Intact and Dental Advisory Given   Pulmonary  breath sounds clear to auscultation        Cardiovascular Rhythm:Regular Rate:Normal     Neuro/Psych    GI/Hepatic   Endo/Other    Renal/GU      Musculoskeletal   Abdominal   Peds  Hematology   Anesthesia Other Findings   Reproductive/Obstetrics                           Anesthesia Physical Anesthesia Plan  ASA: II  Anesthesia Plan: General   Post-op Pain Management:    Induction: Intravenous  Airway Management Planned: Oral ETT  Additional Equipment:   Intra-op Plan:   Post-operative Plan: Extubation in OR  Informed Consent: I have reviewed the patients History and Physical, chart, labs and discussed the procedure including the risks, benefits and alternatives for the proposed anesthesia with the patient or authorized representative who has indicated his/her understanding and acceptance.   Dental advisory given  Plan Discussed with: CRNA, Anesthesiologist and Surgeon  Anesthesia Plan Comments:         Anesthesia Quick Evaluation  

## 2013-05-15 NOTE — Transfer of Care (Signed)
Immediate Anesthesia Transfer of Care Note  Patient: Yesenia Watson  Procedure(s) Performed: Procedure(s): LAPAROSCOPIC CHOLECYSTECTOMY (N/A)  Patient Location: PACU  Anesthesia Type:General  Level of Consciousness: awake, alert , oriented and patient cooperative  Airway & Oxygen Therapy: Patient Spontanous Breathing and Patient connected to nasal cannula oxygen  Post-op Assessment: Report given to PACU RN, Post -op Vital signs reviewed and stable and Patient moving all extremities X 4  Post vital signs: Reviewed and stable  Complications: No apparent anesthesia complications

## 2013-05-15 NOTE — Progress Notes (Signed)
1450: Transferred to [facility] 11 for discharge via stretcher.1515: Returned to PACU via stretcher, the patient was bleeding per report. Bandaid dressing on mid upper abdomen noted with drainage, dried blood. No active bleeding was noted.  All dressings were checked. No active bleeding noted, no hematoma, abdomen remains soft. Dr. Magnus Ivan notified. Permission granted to change bandage dressing.  2x2 gauze and tegaderm placed observing sterile technique. 1600: Went to the bathroom with assist and she was able to void spontaneously. Adequate in amount. Patient was discharged in PACU. Surgical dressings clean dry and intact.

## 2013-05-15 NOTE — Interval H&P Note (Signed)
History and Physical Interval Note: no change in H and P  05/15/2013 11:05 AM  Lynne Leader  has presented today for surgery, with the diagnosis of gallbladder adenomyoma  The various methods of treatment have been discussed with the patient and family. After consideration of risks, benefits and other options for treatment, the patient has consented to  Procedure(s): LAPAROSCOPIC CHOLECYSTECTOMY POSSIBLE IOC (N/A) as a surgical intervention .  The patient's history has been reviewed, patient examined, no change in status, stable for surgery.  I have reviewed the patient's chart and labs.  Questions were answered to the patient's satisfaction.     Jaryan Chicoine A

## 2013-05-19 ENCOUNTER — Encounter (HOSPITAL_COMMUNITY): Payer: Self-pay | Admitting: Surgery

## 2013-06-02 ENCOUNTER — Ambulatory Visit (INDEPENDENT_AMBULATORY_CARE_PROVIDER_SITE_OTHER): Payer: BC Managed Care – PPO | Admitting: Surgery

## 2013-06-02 VITALS — BP 100/68 | HR 66 | Temp 97.7°F | Resp 16 | Ht 64.0 in | Wt 132.6 lb

## 2013-06-02 DIAGNOSIS — Z09 Encounter for follow-up examination after completed treatment for conditions other than malignant neoplasm: Secondary | ICD-10-CM

## 2013-06-02 NOTE — Progress Notes (Signed)
Subjective:     Patient ID: Yesenia Watson, female   DOB: 26-Sep-1950, 63 y.o.   MRN: [id number]  HPI She is here for first postop visit status post laparoscopic cholecystectomy. She is doing well and has no complaints. She is eating well and moving her bowels well.  Review of Systems     Objective:   Physical Exam On exam, her incisions are well healed.  The final pathology confirmed the gallbladder adenoma. There was no evidence of malignancy. There was also chronic cholecystitis    Assessment:     Patient stable postop     Plan:     She may resume her normal activity. I reassured her regarding the path report. I will see her back as needed

## 2013-06-25 ENCOUNTER — Encounter: Payer: Self-pay | Admitting: Family Medicine

## 2014-03-12 IMAGING — CR DG ABDOMEN 2V
2 series · 2 of 2 positions shown · non-contrast
Comparison: None

CLINICAL DATA: Chronic right upper quadrant pain

ABDOMEN - 2 VIEW

[AP (1 of 2)]
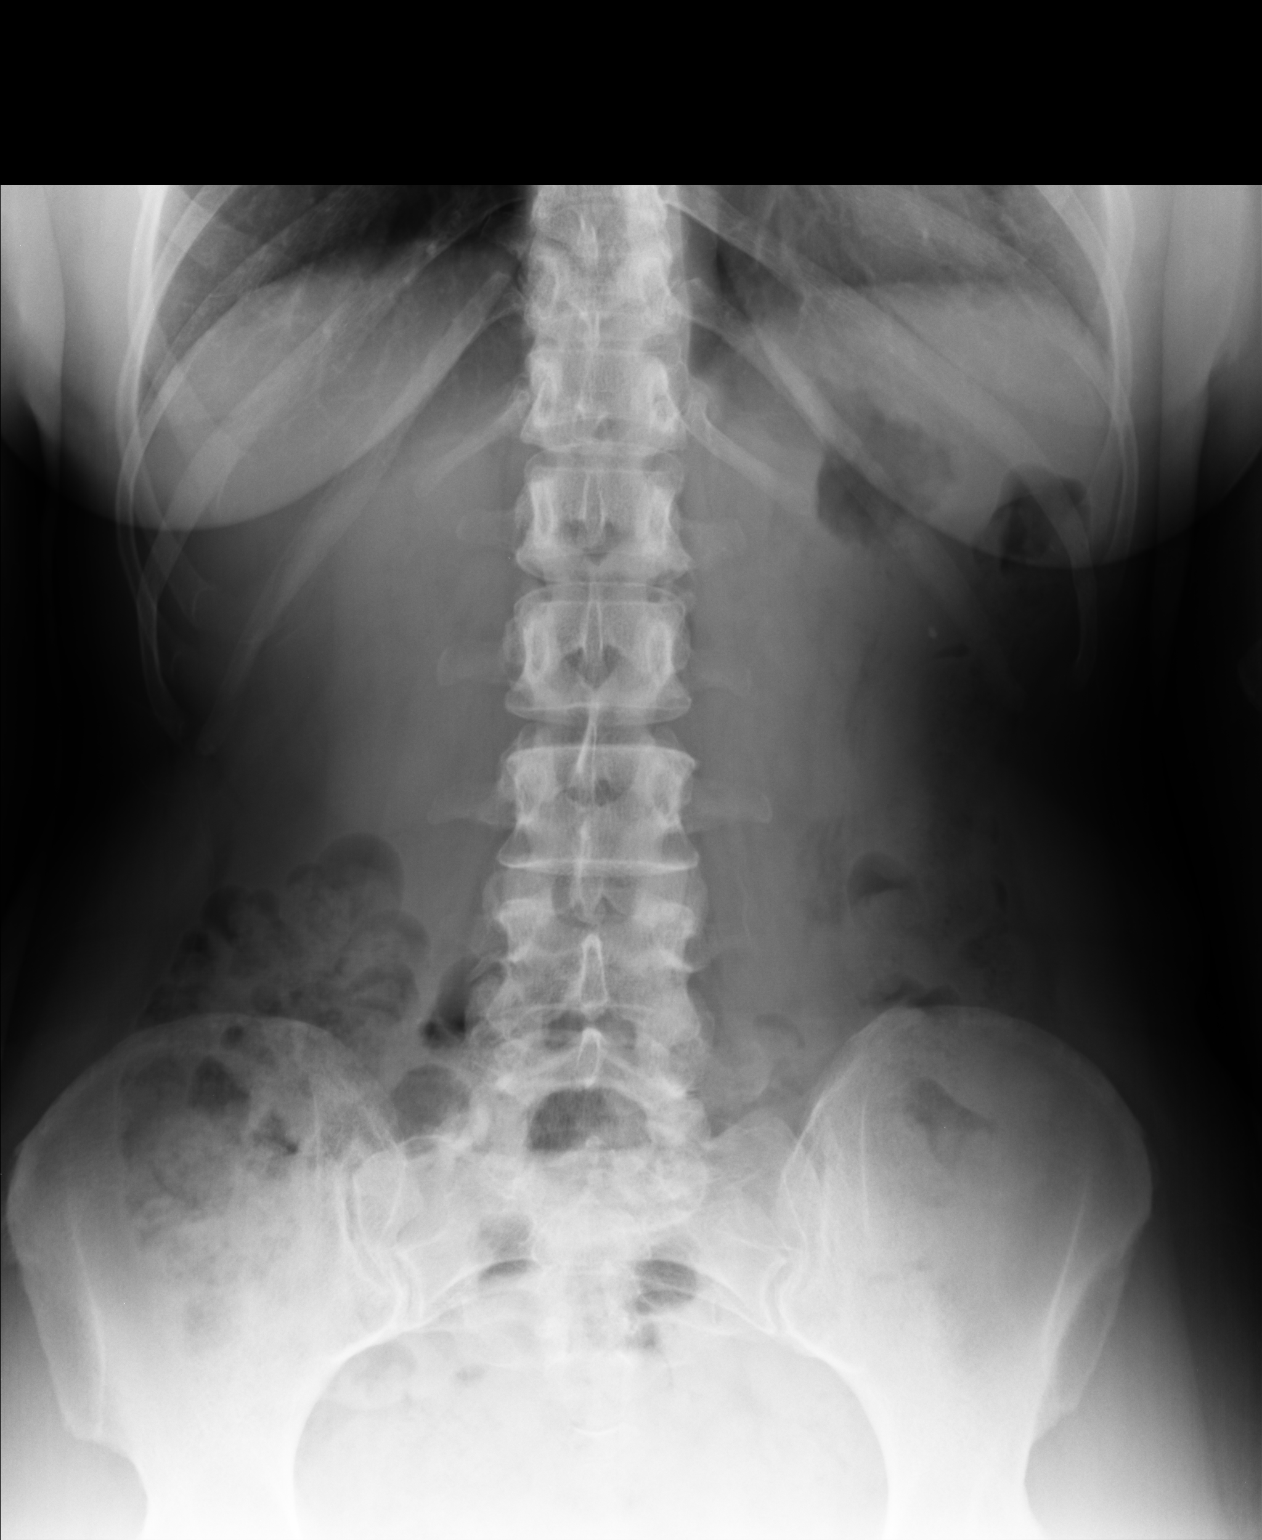

[AP (2 of 2)]
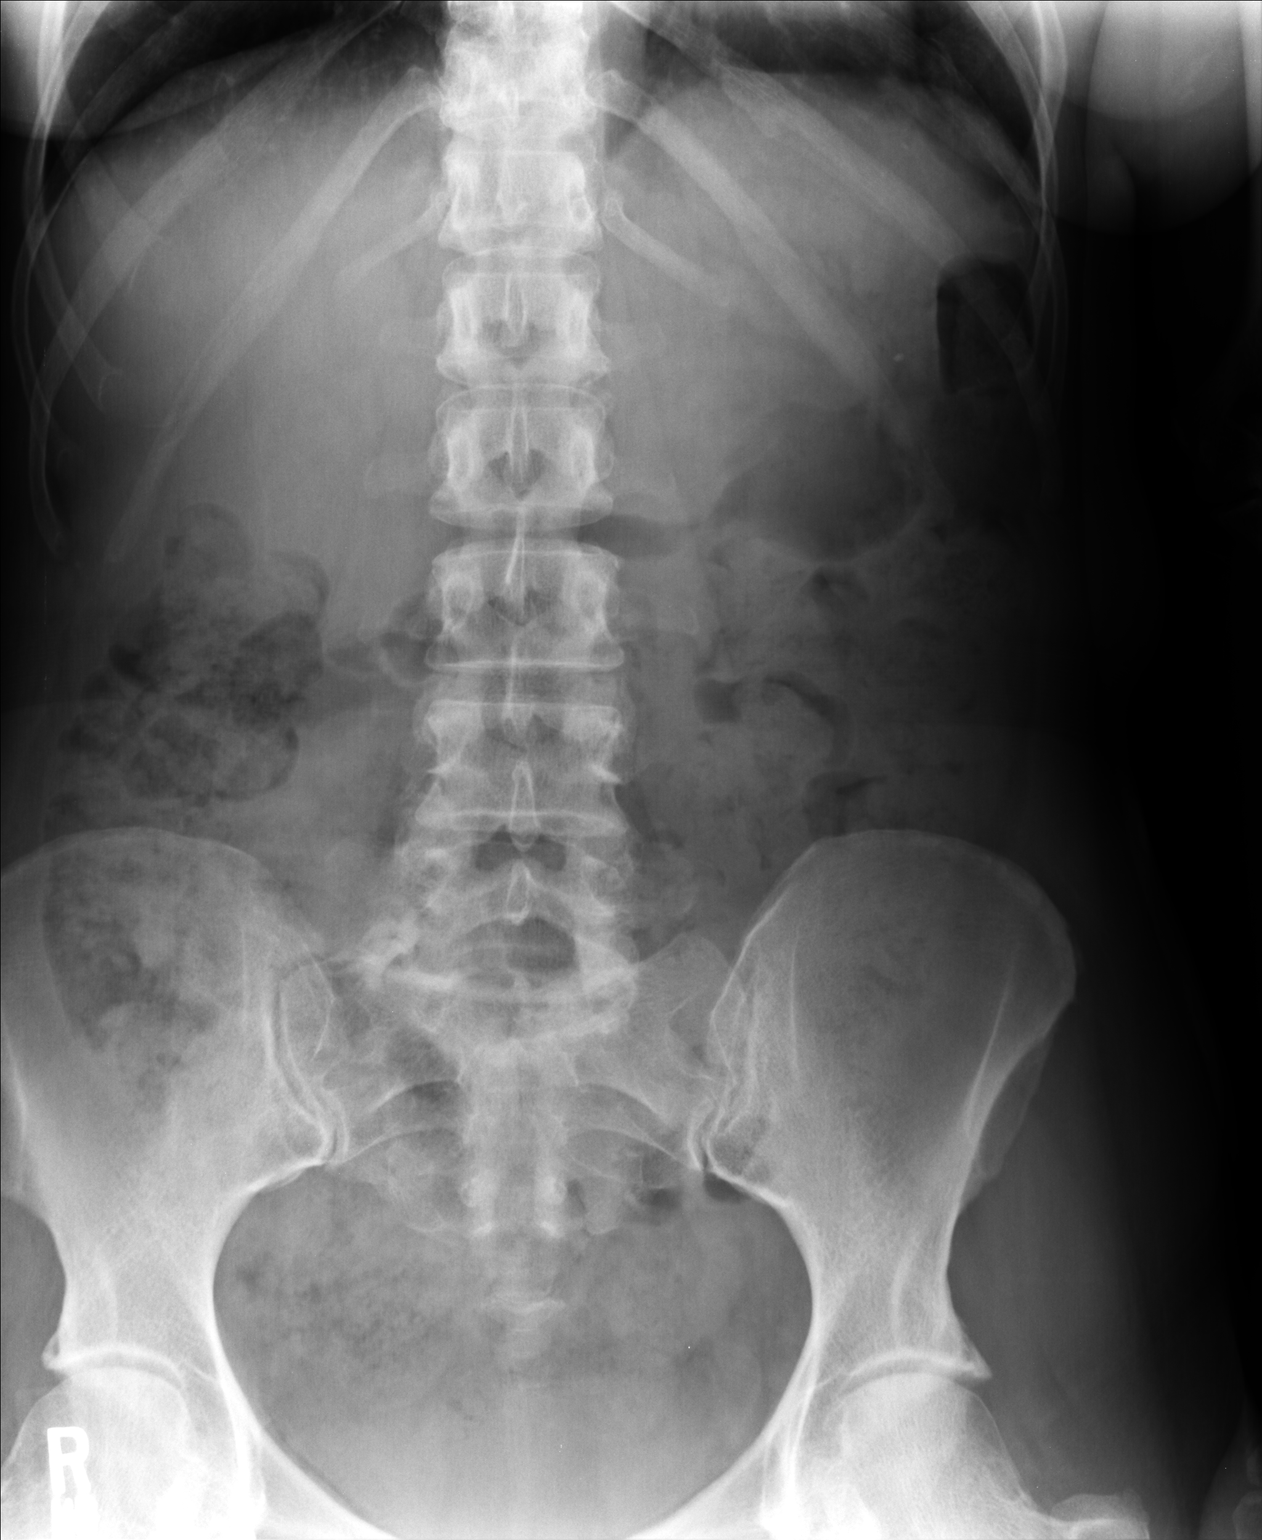

[2 of 2 positions shown; findings below may reference images not displayed]

FINDINGS: Normal bowel gas pattern.  No free air or bowel
obstruction.  No renal calculi.  No significant bony abnormality.
IMPRESSION: Negative

Clinically significant discrepancy from primary report, if
provided: None

## 2014-05-06 ENCOUNTER — Other Ambulatory Visit: Payer: Self-pay | Admitting: Obstetrics and Gynecology

## 2014-05-07 LAB — CYTOLOGY - PAP

## 2016-09-14 ENCOUNTER — Other Ambulatory Visit: Payer: Self-pay | Admitting: Internal Medicine

## 2016-09-14 ENCOUNTER — Ambulatory Visit
Admission: RE | Admit: 2016-09-14 | Discharge: 2016-09-14 | Disposition: A | Discharge status: Home / Self Care | Payer: Medicare HMO | Source: Ambulatory Visit | Attending: Internal Medicine | Admitting: Internal Medicine

## 2016-09-14 DIAGNOSIS — R05 Cough: Secondary | ICD-10-CM

## 2016-09-14 DIAGNOSIS — R059 Cough, unspecified: Secondary | ICD-10-CM

## 2018-04-19 IMAGING — CR DG CHEST 2V
2 series · 2 of 2 positions shown · non-contrast
Comparison: None.

CLINICAL DATA: Cough for 3 weeks.

EXAM:
CHEST  2 VIEW

[w chest pa]
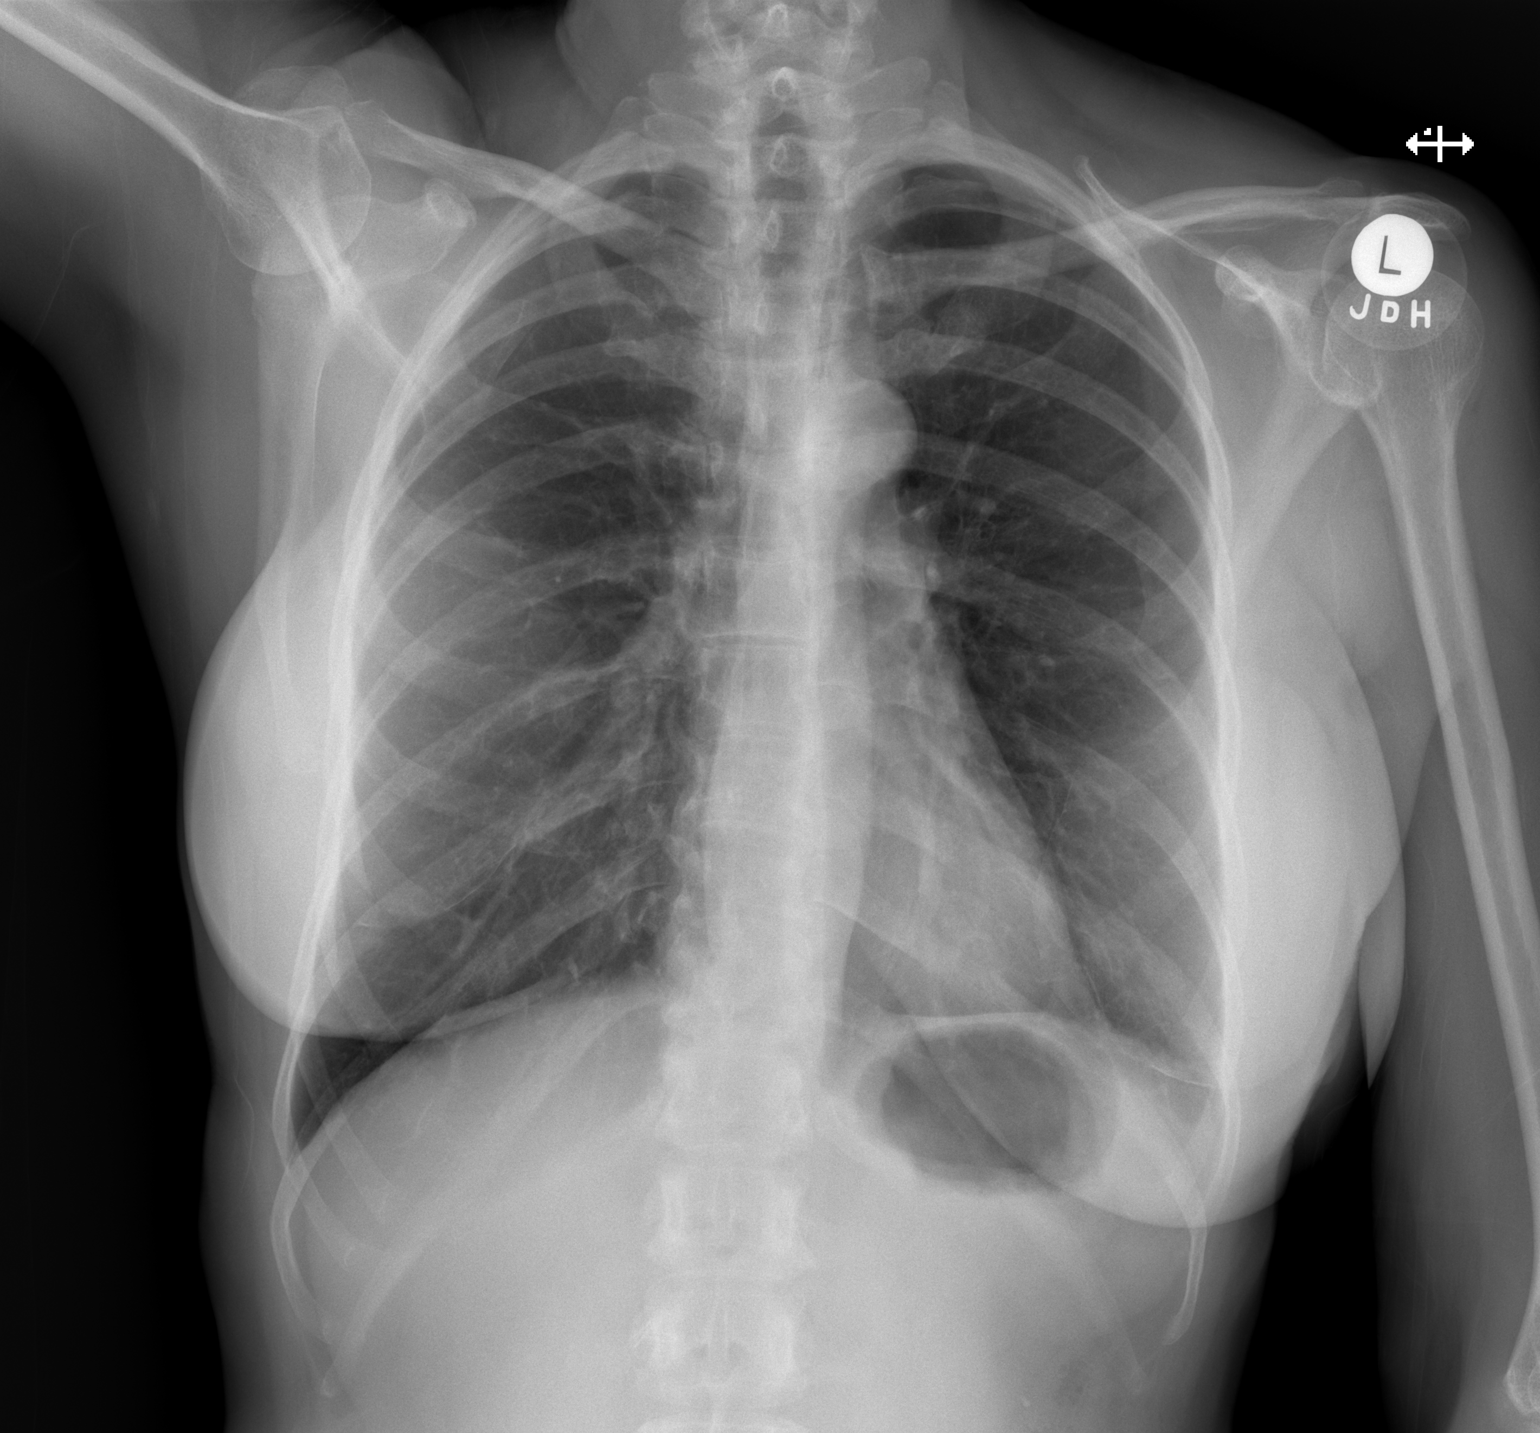

[w chest lat]
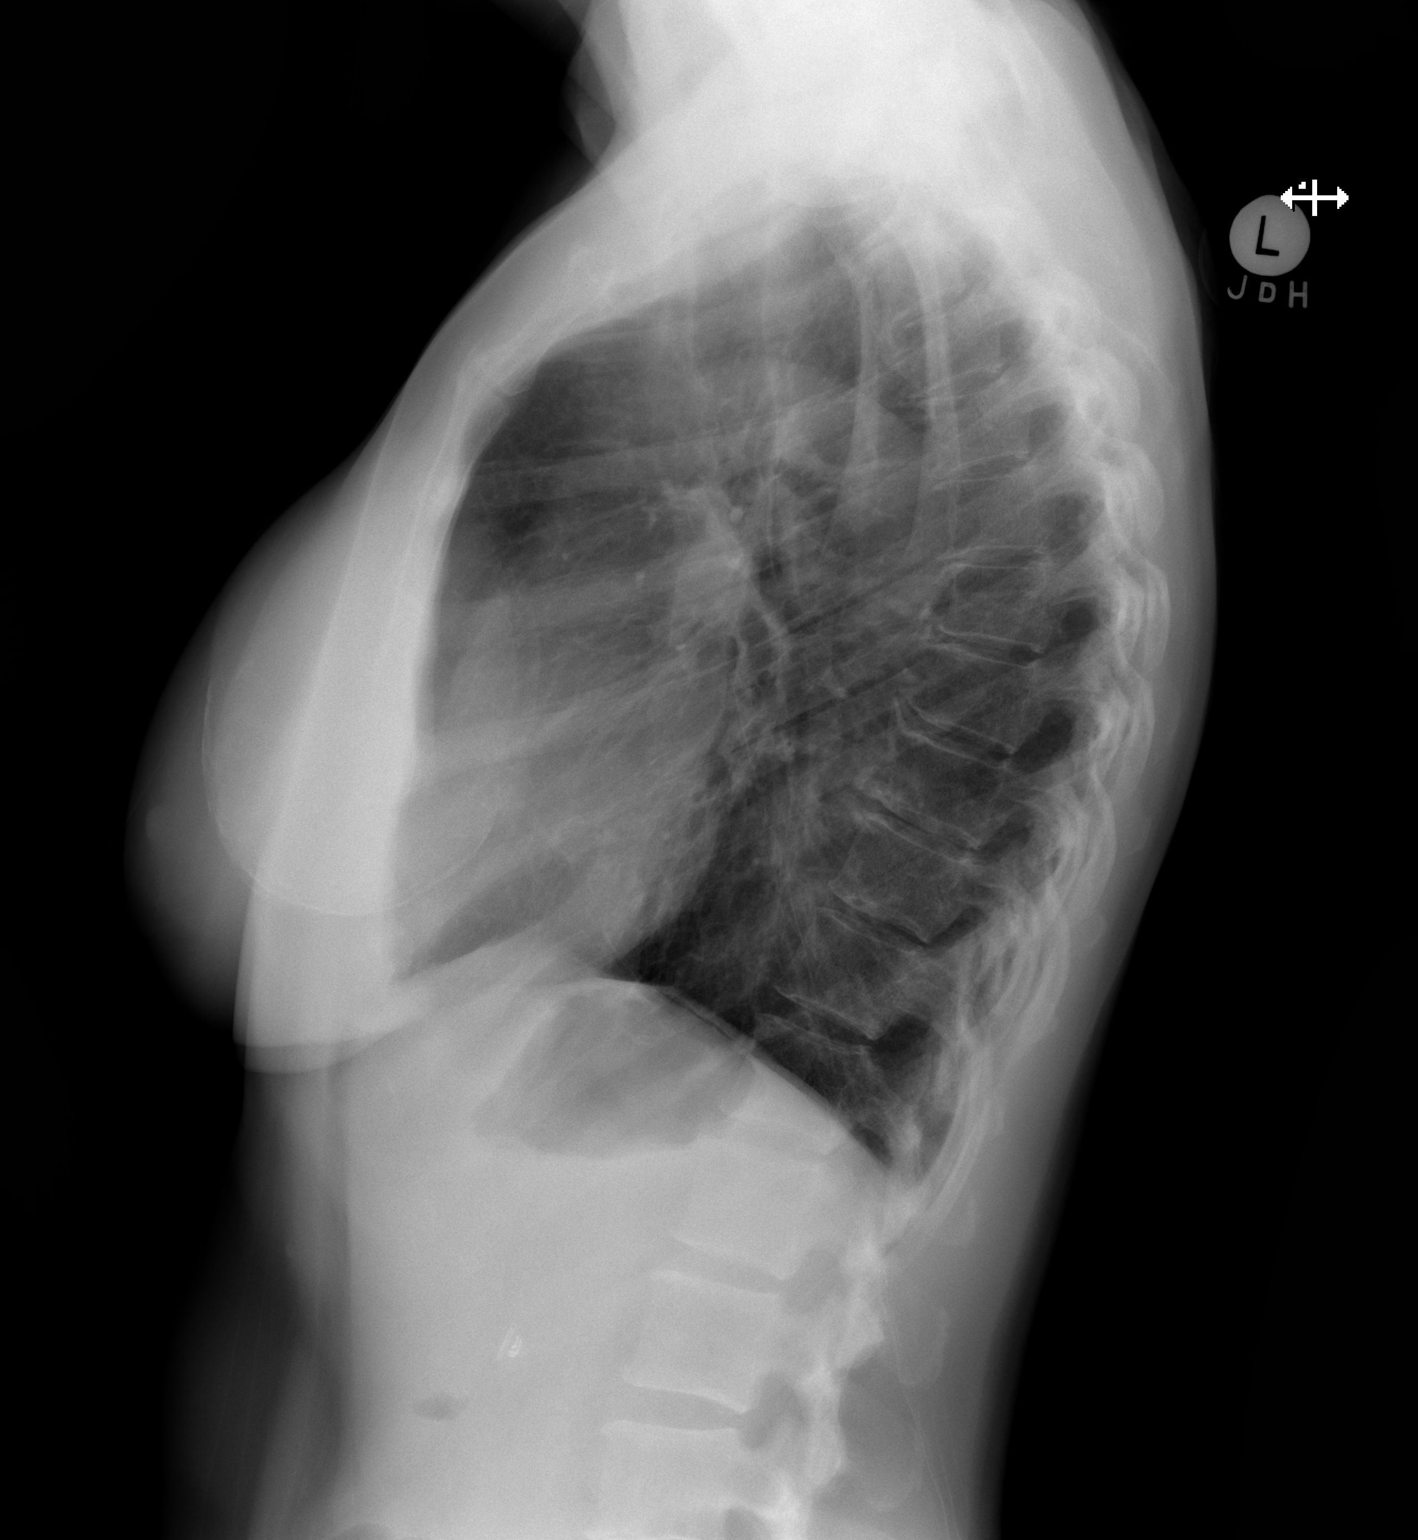

[2 of 2 positions shown; findings below may reference images not displayed]

FINDINGS: The cardiomediastinal silhouette is unremarkable.

Peribronchial thickening noted.

There is no evidence of focal airspace disease, pulmonary edema,
suspicious pulmonary nodule/mass, pleural effusion, or pneumothorax.
No acute bony abnormalities are identified.

Bilateral breast prosthesis noted.
IMPRESSION: Peribronchial thickening without focal pneumonia.

## 2020-03-28 ENCOUNTER — Other Ambulatory Visit: Payer: Self-pay | Admitting: Internal Medicine

## 2020-03-28 DIAGNOSIS — R945 Abnormal results of liver function studies: Secondary | ICD-10-CM

## 2020-03-28 DIAGNOSIS — R7989 Other specified abnormal findings of blood chemistry: Secondary | ICD-10-CM

## 2020-03-28 DIAGNOSIS — R1033 Periumbilical pain: Secondary | ICD-10-CM

## 2020-04-06 ENCOUNTER — Ambulatory Visit
Admission: RE | Admit: 2020-04-06 | Discharge: 2020-04-06 | Disposition: A | Discharge status: Home / Self Care | Payer: Medicare HMO | Source: Ambulatory Visit | Attending: Internal Medicine | Admitting: Internal Medicine

## 2020-04-06 DIAGNOSIS — R7989 Other specified abnormal findings of blood chemistry: Secondary | ICD-10-CM

## 2020-04-06 DIAGNOSIS — R945 Abnormal results of liver function studies: Secondary | ICD-10-CM

## 2020-04-06 DIAGNOSIS — R1033 Periumbilical pain: Secondary | ICD-10-CM

## 2021-11-09 IMAGING — US US ABDOMEN COMPLETE
1 series · 13 of 25 positions shown · non-contrast
Comparison: Abdominal MRI 04/16/2013. CT abdomen and pelvis
09/17/2016.

CLINICAL DATA: Elevated liver function tests. Diarrhea for 2 weeks.
Prior cholecystectomy.

EXAM:
ABDOMEN ULTRASOUND COMPLETE

[Series 1: us abdomen complete · 0.19mm/px · 13 of 82 slices shown]
[im 1/82]
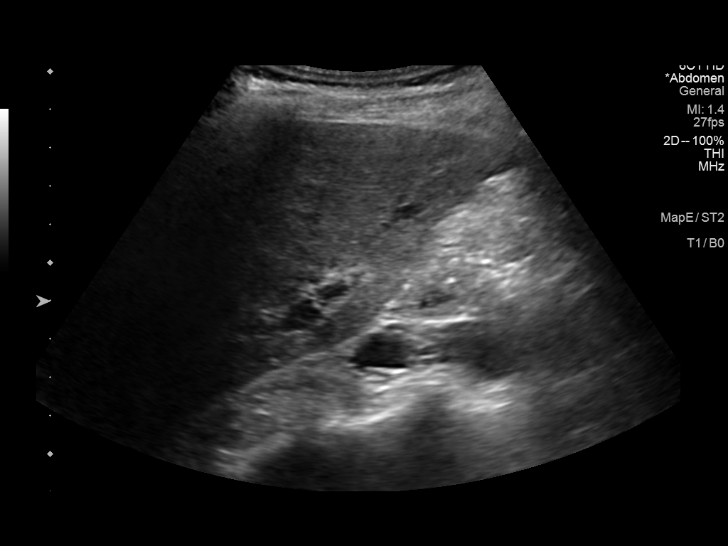
[im 7/82]
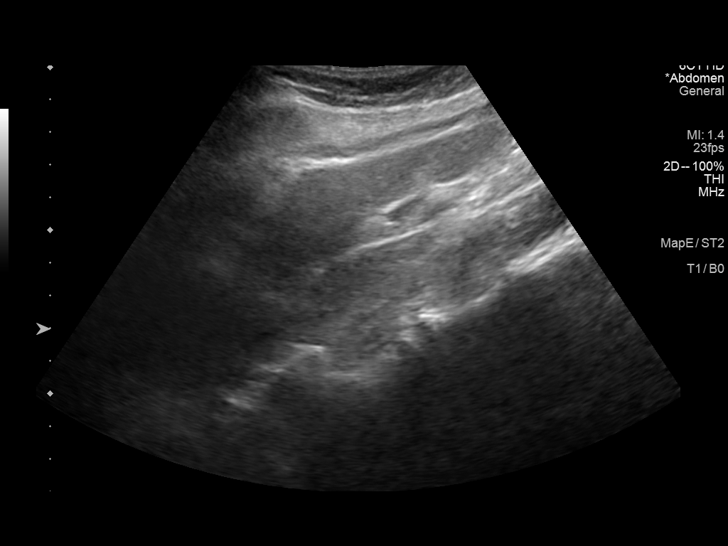
[im 14/82]
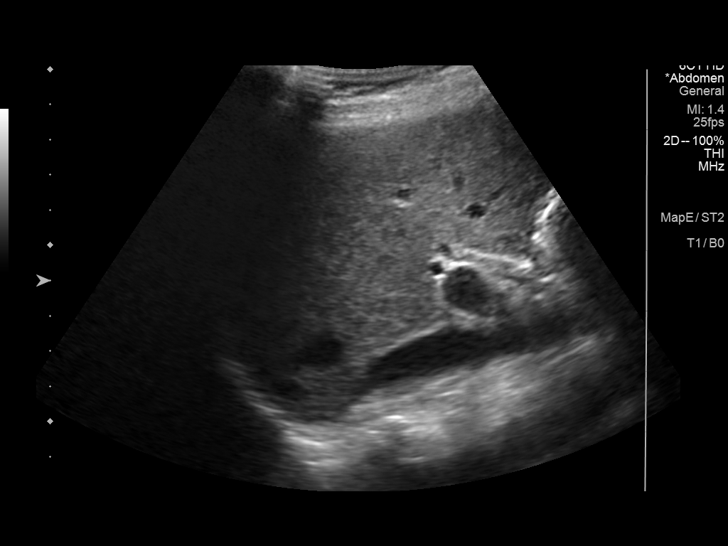
[im 21/82]
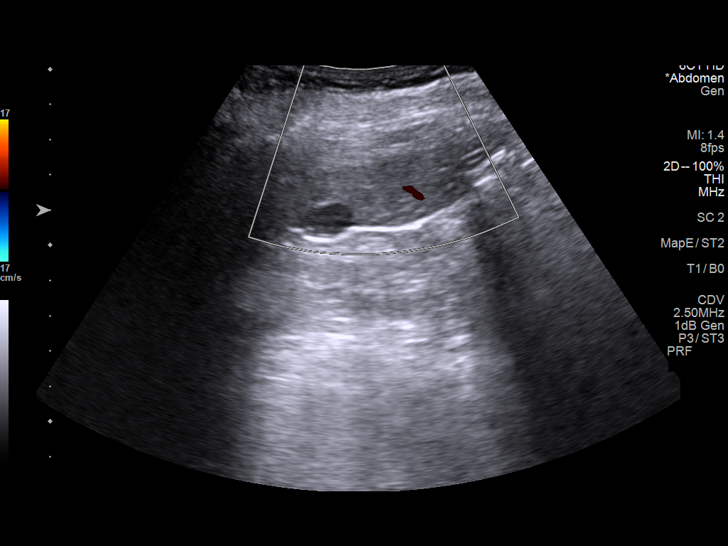
[im 28/82]
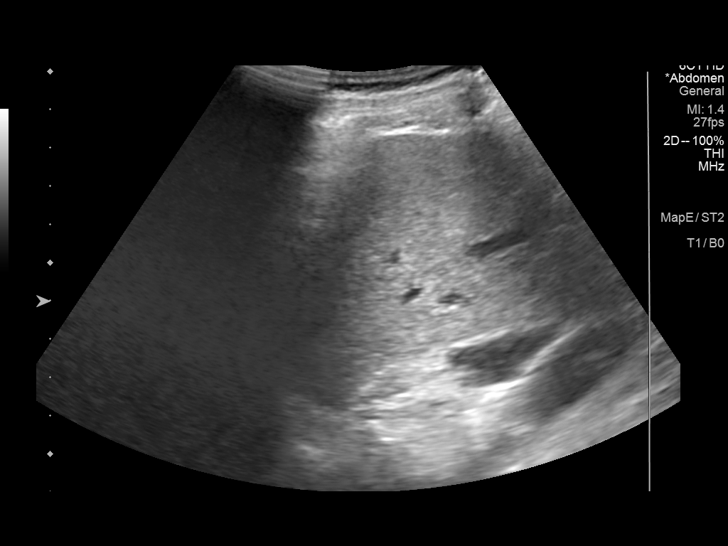
[im 34/82]
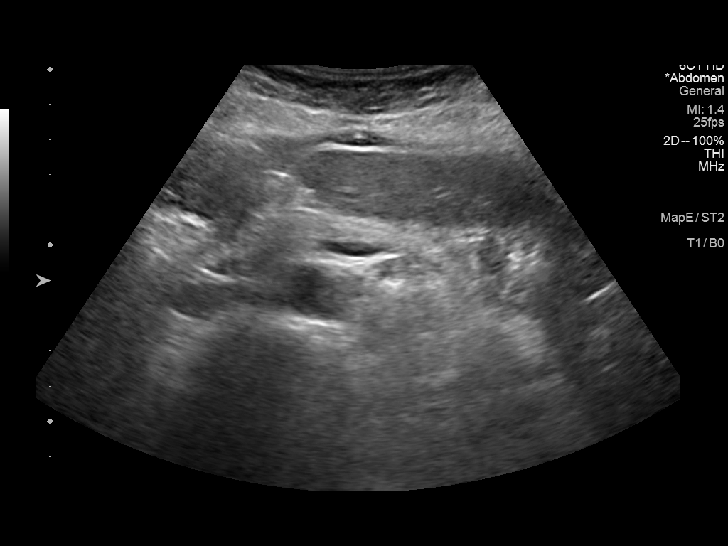
[im 41/82]
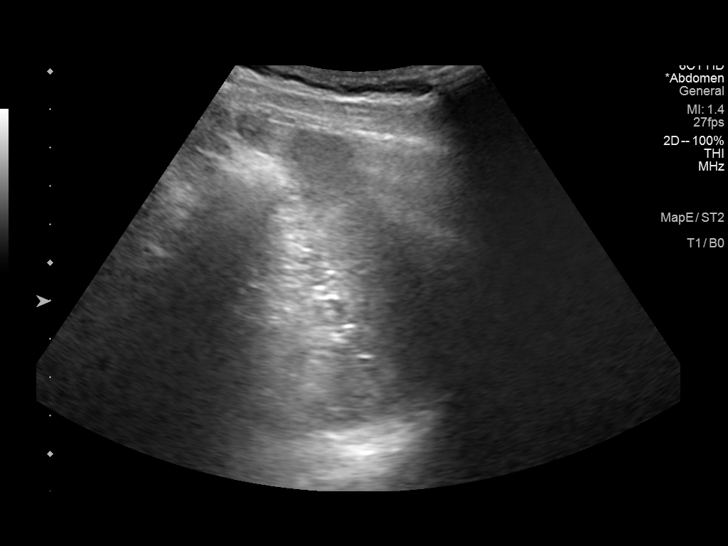
[im 48/82]
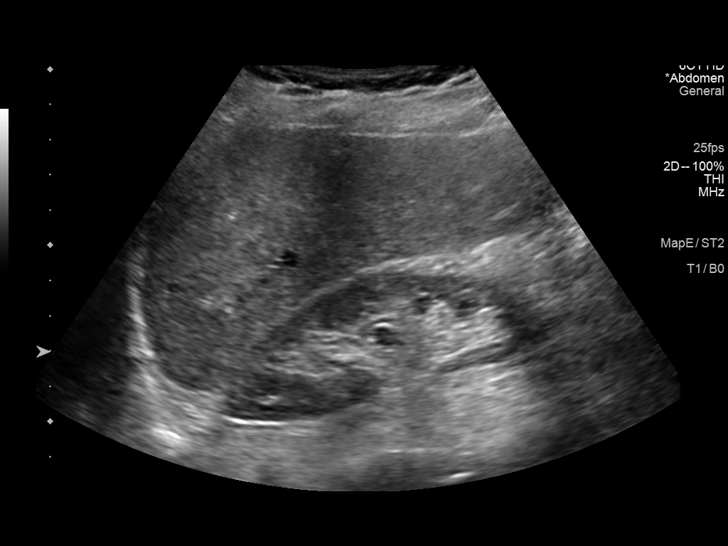
[im 55/82]
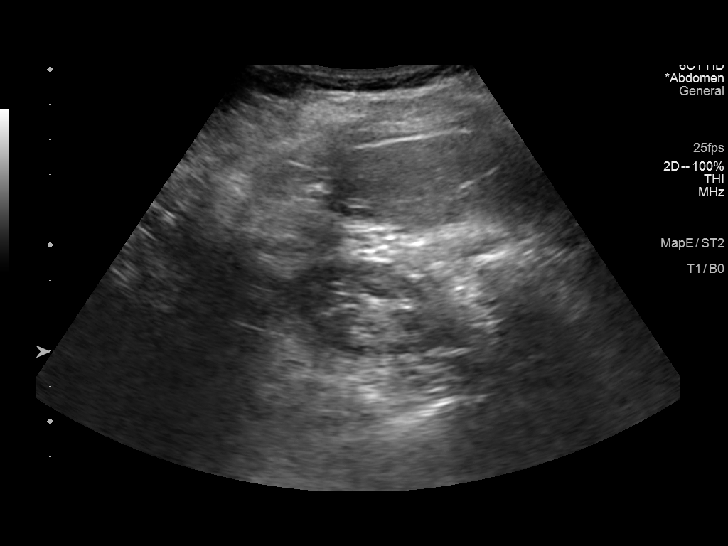
[im 61/82]
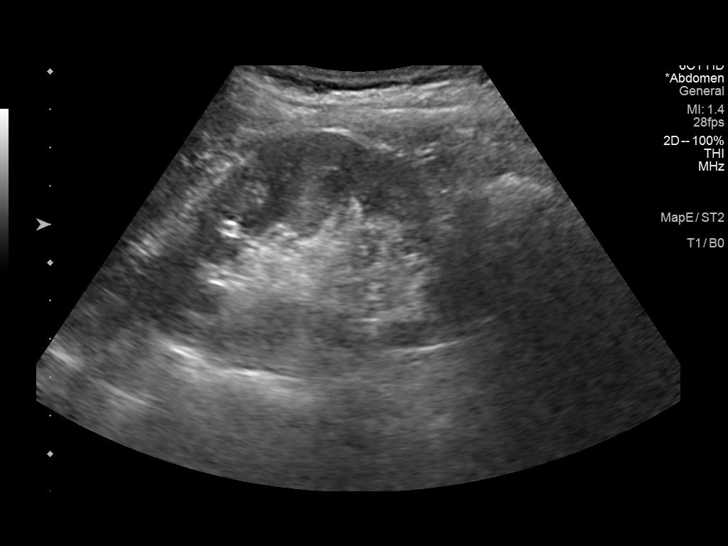
[im 68/82]
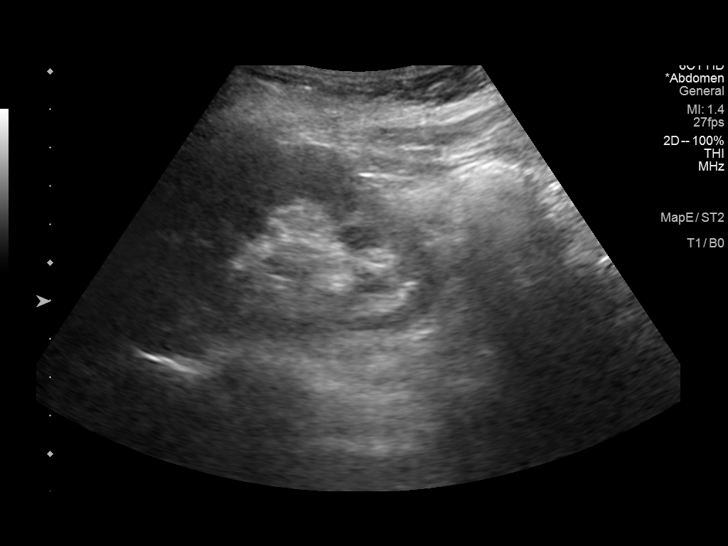
[im 75/82]
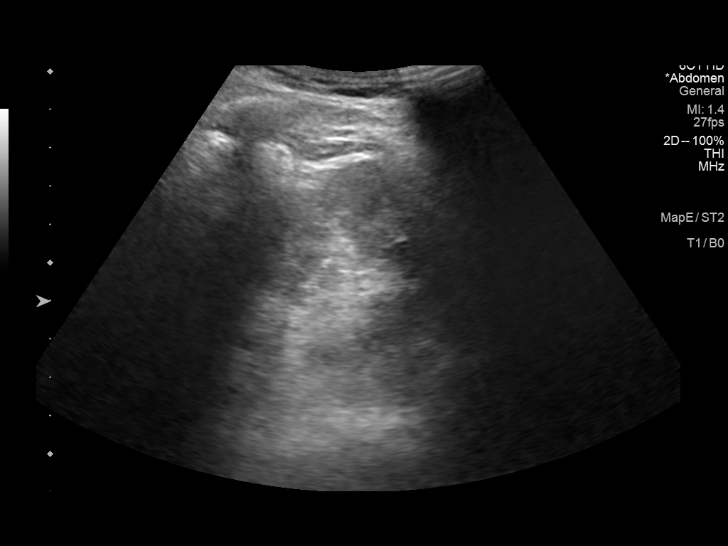
[im 82/82]
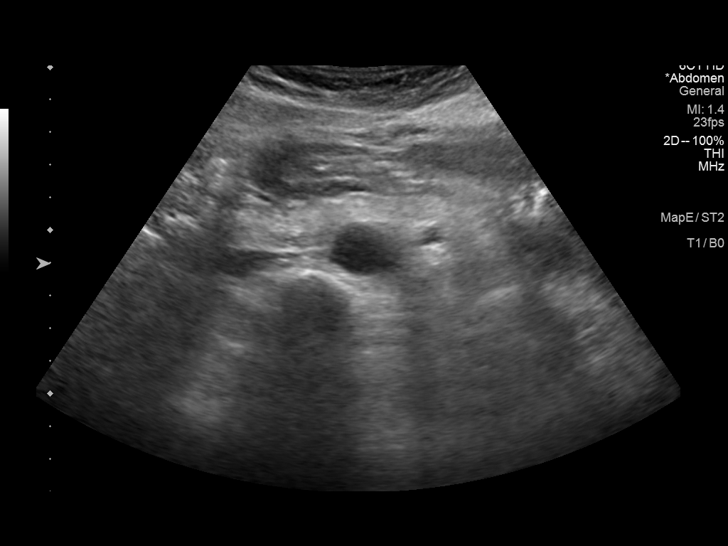

[13 of 25 positions shown; findings below may reference images not displayed]

FINDINGS: Gallbladder: Surgically absent.

Common bile duct: Diameter: 4 mm

Liver: Mildly increased parenchymal echogenicity diffusely.
Unchanged 15 mm cyst inferiorly in the right hepatic lobe. Portal
vein is patent on color Doppler imaging with normal direction of
blood flow towards the liver.

IVC: No abnormality visualized.

Pancreas: Limited visualization of the tail. Visualized portion
unremarkable.

Spleen: Size and appearance within normal limits.

Right Kidney: Length: 8.8 cm. Limited assessment due to bowel gas.
No mass or hydronephrosis visualized.

Left Kidney: Length: 9.1 cm. Limited assessment due to bowel gas. No
mass or hydronephrosis visualized.

Abdominal aorta: No aneurysm visualized.

Other findings: None.
IMPRESSION: 1. Mildly increased parenchymal echogenicity of the liver,
nonspecific but may reflect steatosis. Unchanged small hepatic cyst.
2. Status post cholecystectomy. No biliary dilatation.
3. Limited assessment of the kidneys due to bowel gas. No
hydronephrosis.
